# Patient Record
Sex: Female | Born: 1965
Health system: Southern US, Community
[De-identification: ages and names within clinical notes are randomized; demographics above are authoritative.]

## PROBLEM LIST (undated history)

## (undated) DIAGNOSIS — K219 Gastro-esophageal reflux disease without esophagitis: Secondary | ICD-10-CM

## (undated) DIAGNOSIS — T4145XA Adverse effect of unspecified anesthetic, initial encounter: Secondary | ICD-10-CM

## (undated) DIAGNOSIS — F419 Anxiety disorder, unspecified: Secondary | ICD-10-CM

## (undated) DIAGNOSIS — T8859XA Other complications of anesthesia, initial encounter: Secondary | ICD-10-CM

## (undated) DIAGNOSIS — R569 Unspecified convulsions: Secondary | ICD-10-CM

## (undated) HISTORY — PX: ABDOMINAL HYSTERECTOMY: SHX81

## (undated) HISTORY — PX: CHOLECYSTECTOMY: SHX55

## (undated) HISTORY — DX: Unspecified convulsions: R56.9

## (undated) HISTORY — PX: TUBAL LIGATION: SHX77

## (undated) HISTORY — PX: BREAST BIOPSY: SHX20

---

## 2003-06-07 HISTORY — PX: OTHER SURGICAL HISTORY: SHX169

## 2007-06-07 LAB — CONVERTED CEMR LAB

## 2007-08-29 ENCOUNTER — Ambulatory Visit: Payer: Self-pay | Admitting: Family Medicine

## 2007-08-29 DIAGNOSIS — Z862 Personal history of diseases of the blood and blood-forming organs and certain disorders involving the immune mechanism: Secondary | ICD-10-CM

## 2007-08-29 DIAGNOSIS — R209 Unspecified disturbances of skin sensation: Secondary | ICD-10-CM

## 2007-08-29 DIAGNOSIS — G43009 Migraine without aura, not intractable, without status migrainosus: Secondary | ICD-10-CM | POA: Insufficient documentation

## 2007-08-29 HISTORY — DX: Migraine without aura, not intractable, without status migrainosus: G43.009

## 2007-08-29 HISTORY — DX: Unspecified disturbances of skin sensation: R20.9

## 2007-08-29 HISTORY — DX: Personal history of diseases of the blood and blood-forming organs and certain disorders involving the immune mechanism: Z86.2

## 2007-08-30 ENCOUNTER — Encounter: Payer: Self-pay | Admitting: Family Medicine

## 2007-08-30 LAB — CONVERTED CEMR LAB
ALT: 18 units/L (ref 0–35)
AST: 21 units/L (ref 0–37)
Albumin: 4.9 g/dL (ref 3.5–5.2)
Alkaline Phosphatase: 84 units/L (ref 39–117)
BUN: 12 mg/dL (ref 6–23)
Basophils Absolute: 0 10*3/uL (ref 0.0–0.1)
Basophils Relative: 0 % (ref 0–1)
CO2: 22 meq/L (ref 19–32)
Calcium: 9.4 mg/dL (ref 8.4–10.5)
Chloride: 104 meq/L (ref 96–112)
Cholesterol: 160 mg/dL (ref 0–200)
Creatinine, Ser: 0.73 mg/dL (ref 0.40–1.20)
Eosinophils Absolute: 0.2 10*3/uL (ref 0.0–0.7)
Eosinophils Relative: 2 % (ref 0–5)
Glucose, Bld: 95 mg/dL (ref 70–99)
HCT: 39.6 % (ref 36.0–46.0)
HDL: 68 mg/dL (ref >39)
Hemoglobin: 13.4 g/dL (ref 12.0–15.0)
Iron: 112 ug/dL (ref 42–145)
LDL Cholesterol: 77 mg/dL (ref 0–99)
Lymphocytes Relative: 31 % (ref 12–46)
Lymphs Abs: 2.3 10*3/uL (ref 0.7–4.0)
MCHC: 33.8 g/dL (ref 30.0–36.0)
MCV: 92.1 fL (ref 78.0–100.0)
Monocytes Absolute: 0.6 10*3/uL (ref 0.1–1.0)
Monocytes Relative: 9 % (ref 3–12)
Neutro Abs: 4.3 10*3/uL (ref 1.7–7.7)
Neutrophils Relative %: 58 % (ref 43–77)
Platelets: 226 10*3/uL (ref 150–400)
Potassium: 4.4 meq/L (ref 3.5–5.3)
RBC: 4.3 M/uL (ref 3.87–5.11)
RDW: 12.7 % (ref 11.5–15.5)
Saturation Ratios: 28 % (ref 20–55)
Sodium: 139 meq/L (ref 135–145)
TIBC: 404 ug/dL (ref 250–470)
Total Bilirubin: 0.6 mg/dL (ref 0.3–1.2)
Total CHOL/HDL Ratio: 2.4
Total Protein: 7.3 g/dL (ref 6.0–8.3)
Triglycerides: 76 mg/dL (ref <150)
UIBC: 292 ug/dL
VLDL: 15 mg/dL (ref 0–40)
WBC: 7.4 10*3/uL (ref 4.0–10.5)

## 2009-01-30 ENCOUNTER — Ambulatory Visit: Payer: Self-pay | Admitting: Family Medicine

## 2009-01-30 DIAGNOSIS — J309 Allergic rhinitis, unspecified: Secondary | ICD-10-CM

## 2009-01-30 HISTORY — DX: Allergic rhinitis, unspecified: J30.9

## 2009-04-13 ENCOUNTER — Ambulatory Visit: Payer: Self-pay | Admitting: Family Medicine

## 2009-04-15 ENCOUNTER — Telehealth: Payer: Self-pay | Admitting: Family Medicine

## 2010-03-17 ENCOUNTER — Encounter: Admission: RE | Admit: 2010-03-17 | Discharge: 2010-03-17 | Payer: Self-pay | Admitting: Family Medicine

## 2010-03-18 LAB — HM MAMMOGRAPHY: HM Mammogram: NORMAL

## 2010-03-30 ENCOUNTER — Ambulatory Visit: Payer: Self-pay | Admitting: Obstetrics & Gynecology

## 2010-07-02 ENCOUNTER — Ambulatory Visit
Admission: RE | Admit: 2010-07-02 | Discharge: 2010-07-02 | Payer: Self-pay | Source: Home / Self Care | Attending: Family Medicine | Admitting: Family Medicine

## 2010-07-02 DIAGNOSIS — F411 Generalized anxiety disorder: Secondary | ICD-10-CM

## 2010-07-02 HISTORY — DX: Generalized anxiety disorder: F41.1

## 2010-07-03 ENCOUNTER — Encounter: Payer: Self-pay | Admitting: Family Medicine

## 2010-07-04 LAB — CONVERTED CEMR LAB: TSH: 1.239 microintl units/mL (ref 0.350–4.500)

## 2010-07-06 NOTE — Assessment & Plan Note (Signed)
Summary: Acute Bronchitis   Vital Signs:  Patient profile:   45 year old female Height:      61.25 inches Weight:      132 pounds BMI:     24.83 O2 Sat:      98 % on Room air Temp:     97.0 degrees F oral Pulse rate:   80 / minute BP sitting:   108 / 64  (left arm) Cuff size:   regular  Vitals Entered By: Kathlene November (April 13, 2009 1:02 PM)  O2 Flow:  Room air CC: sore throat, productive cough with green blood tinged sputum, low grade temp, chest congestion, SOB, sneezing. Started last Wednesday   Primary Care Provider:  Linford Arnold, C  CC:  sore throat, productive cough with green blood tinged sputum, low grade temp, chest congestion, SOB, and sneezing. Started last Wednesday.  History of Present Illness: sore throat, productive cough with green blood tinged sputum, low grade temp, chest congestion, SOB, sneezing. Started last Wednesday. Felt she is getting worse. Nasal congestion.  Tried some  mucinex and helped some. Says SOB causes a cough. Thinks she could be [redacted] weeks pregnant but she is bleeding right now.   Current Medications (verified): 1)  None  Allergies (verified): 1)  ! Demerol  Comments:  Nurse/Medical Assistant: The patient's medications and allergies were reviewed with the patient and were updated in the Medication and Allergy Lists. Kathlene November (April 13, 2009 1:03 PM)  Physical Exam  General:  Well-developed,well-nourished,in no acute distress; alert,appropriate and cooperative throughout examination Head:  Normocephalic and atraumatic without obvious abnormalities. No apparent alopecia or balding. Eyes:  No corneal or conjunctival inflammation noted. EOMI. Perrla. Ears:  External ear exam shows no significant lesions or deformities.  Otoscopic examination reveals clear canals, tympanic membranes are intact bilaterally without bulging, retraction, inflammation or discharge. Hearing is grossly normal bilaterally. Nose:  External nasal examination  shows no deformity or inflammation. Nasal mucosa are pink and moist without lesions or exudates. Mouth:  Oral mucosa and oropharynx without lesions or exudates.  Teeth in good repair. Neck:  Mild anterior cerv LN>  Lungs:  Normal respiratory effort, chest expands symmetrically. Lungs are clear to auscultation, no crackles or wheezes. Heart:  Normal rate and regular rhythm. S1 and S2 normal without gallop, murmur, click, rub or other extra sounds. Pulses:  Radial 2+  Skin:  no rashes.   Cervical Nodes:  Mild anterior cerv LN.  Psych:  Cognition and judgment appear intact. Alert and cooperative with normal attention span and concentration. No apparent delusions, illusions, hallucinations   Impression & Recommendations:  Problem # 1:  BRONCHITIS, ACUTE (ICD-466.0)  Her updated medication list for this problem includes:    Hydrocodone-homatropine 5-1.5 Mg/56ml Syrp (Hydrocodone-homatropine) .[name] KitchenMarland KitchenMarland KitchenMarland Kitchen 5ml by mouth at bedtime as needed   Encouraged to push clear liquids, get enough rest, and take acetaminophen as needed. To be seen in 5-7 days if no improvement, sooner if worse. Syrup at bedtime as needed . It is category C but will use sparingly. If not better in a couple of days then will treat with Z-pack.   Complete Medication List: 1)  Hydrocodone-homatropine 5-1.5 Mg/34ml Syrp (Hydrocodone-homatropine) .... 5ml by mouth at bedtime as needed Prescriptions: HYDROCODONE-HOMATROPINE 5-1.5 MG/5ML SYRP (HYDROCODONE-HOMATROPINE) 5ml by mouth at bedtime as needed  #123ml x 0   Entered and Authorized by:   Nani Gasser MD   Signed by:   Nani Gasser MD on 04/13/2009   Method used:  Print then Give to Patient   RxID:   [id number]

## 2010-07-06 NOTE — Progress Notes (Signed)
Summary: Need a Rx called in  Phone Note Call from Patient   Caller: Patient Summary of Call: Dr.Metheney     Patient's Call Back [phone]         [facility] [address]  [phone]  Patient was seen on Monday and was told if she wasnt feeling better by today that an Antibotic could be called in for her. Initial call taken by: Vanessa Swaziland,  April 15, 2009 8:24 AM  Follow-up for Phone Call        OK, rx sent.  Follow-up by: Nani Gasser MD,  April 15, 2009 9:13 AM  Additional Follow-up for Phone Call Additional follow up Details #1::        pt notified med sent to her pharmacy Additional Follow-up by: Kathlene November,  April 15, 2009 9:16 AM    New/Updated Medications: ZITHROMAX Z-PAK 250 MG TABS (AZITHROMYCIN) Take as directed. Prescriptions: ZITHROMAX Z-PAK 250 MG TABS (AZITHROMYCIN) Take as directed.  #1 x 0   Entered and Authorized by:   Nani Gasser MD   Signed by:   Nani Gasser MD on 04/15/2009   Method used:   [facility]  [id number]       [address]       [place], [place]  [address]       Ph: [phone] or [phone]       Fax: [phone]   RxID:   [id number]

## 2010-07-13 ENCOUNTER — Telehealth: Payer: Self-pay | Admitting: Family Medicine

## 2010-07-14 NOTE — Assessment & Plan Note (Signed)
Summary: Destiny King, Anxiety   Vital Signs:  Patient profile:   45 year old female Height:      61.25 inches Weight:      142 pounds Pulse rate:   96 / minute BP sitting:   121 / 77  (right arm) Cuff size:   regular  Vitals Entered By: Avon Gully CMA, Duncan Dull) (July 02, 2010 1:57 PM) CC: HA, sinus pressure, face hurts   Primary Care Provider:  Metheney, C  CC:  HA, sinus pressure, and face hurts.  History of Present Illness: HA, sinus pressure, face hurts.  Left maxillary pressure for one week. NO fever. Feels weak and ache all over.  Head is tender to touch.  Teeth hurt on the left side as well.  No recent URI.  Taking mucinex and helps some.  No sinus congestion, + post nasal drip.   Has been more jittery and anxious for years but was never really a problem until more recently. Feels shakey on the inside.  Last few periods have been a little off the last 3-4 cycles.  Seems worse before her periods. Notices worse before her periods. Gets severe breast pain and swelling.  Sleeping OK. Occ feels down but moslt anxieious.  Not concentrating well.   Current Medications (verified): 1)  None  Allergies (verified): 1)  ! Demerol  Comments:  Nurse/Medical Assistant: The patient's medications and allergies were reviewed with the patient and were updated in the Medication and Allergy Lists. Avon Gully CMA, Duncan Dull) (July 02, 2010 1:57 PM)  Social History: Reently lost her job.  married  with 2 children.  One child still lives at home.   Never Smoked Alcohol use-no Drug use-no Regular exercise-no  Physical Exam  General:  Well-developed,well-nourished,in no acute distress; alert,appropriate and cooperative throughout examination Head:  Normocephalic and atraumatic without obvious abnormalities. No apparent alopecia or balding. Eyes:  No corneal or conjunctival inflammation noted. EOMI. Perrla.  Ears:  External ear exam shows no significant lesions or deformities.   Otoscopic examination reveals clear canals, tympanic membranes are intact bilaterally without bulging, retraction, inflammation or discharge. Hearing is grossly normal bilaterally. Nose:  External nasal examination shows no deformity or inflammation.  Mouth:  Oral mucosa and oropharynx without lesions or exudates.  Teeth in good repair. Neck:  No deformities, masses, or tenderness noted. Lungs:  Normal respiratory effort, chest expands symmetrically. Lungs are clear to auscultation, no crackles or wheezes. Heart:  Normal rate and regular rhythm. S1 and S2 normal without gallop, murmur, click, rub or other extra sounds. Skin:  no rashes.   Cervical Nodes:  No lymphadenopathy noted Psych:  Cognition and judgment appear intact. Alert and cooperative with normal attention span and concentration. No apparent delusions, illusions, hallucinations   Impression & Recommendations:  Problem # 1:  SINUSITIS - ACUTE-NOS (ICD-461.9) Assessment New  The following medications were removed from the medication list:    Hydrocodone-homatropine 5-1.5 Mg/62ml Syrp (Hydrocodone-homatropine) .[name] KitchenMarland KitchenMarland KitchenMarland Kitchen 5ml by mouth at bedtime as needed    Zithromax Z-pak 250 Mg Tabs (Azithromycin) .[name] Kitchen... Take as directed. Her updated medication list for this problem includes:    Amoxicillin 500 Mg Caps (Amoxicillin) .[name] Kitchen... Take 1 tablet by mouth two times a day for 10 days  Instructed on treatment. Call if symptoms persist or worsen.   Problem # 2:  ANXIETY (ICD-300.00) Assessment: New Dsicussed that I will have her fill out a PHQ- 9 today and then we can check her thyroid level today. If normal then f/u in  one week to discuss tx options. She is not interested in birth control even thougth she feels her hormones are likely affecting her anxiety. .  Orders: T-TSH ([id number])  Complete Medication List: 1)  Amoxicillin 500 Mg Caps (Amoxicillin) .... Take 1 tablet by mouth two times a day for 10 days  Patient Instructions: 1)   Complete the antibiotic and call if not better in 2 weeks. 2)  Please follow-up in one week to discuss your anxiety symptoms.  Prescriptions: AMOXICILLIN 500 MG CAPS (AMOXICILLIN) Take 1 tablet by mouth two times a day for 10 days  #20 x 0   Entered and Authorized by:   Nani Gasser MD   Signed by:   Nani Gasser MD on 07/02/2010   Method used:   [facility]        [facility]  [id number] ([facility]ail[facility]       [address]       [place], [place]  [address]       Ph: [phone] or [phone]       Fax: [phone]   RxID:   [id number]    Orders Added: 1)  T-TSH [[id number]] 2)  Est. Patient Level IV [[address]64]

## 2010-07-22 NOTE — Progress Notes (Signed)
Summary: meds  Phone Note Call from Patient   Caller: Patient Call For: Destiny Gasser MD Summary of Call: pt called and states she has a hx of mild seizures and the medication sent in states to not take if she has a hx of seizures.Pt wants [place] to cancel rx Initial call taken by: Avon Gully CMA, Duncan Dull),  July 13, 2010 3:04 PM  Follow-up for Phone Call        I have never heard that contraindication but will change her to citalopram.  Follow-up by: Destiny Gasser MD,  July 14, 2010 7:23 AM  Additional Follow-up for Phone Call Additional follow up Details #1::        pt notified Additional Follow-up by: Avon Gully CMA, Duncan Dull),  July 15, 2010 10:01 AM    New/Updated Medications: CITALOPRAM HYDROBROMIDE 20 MG TABS (CITALOPRAM HYDROBROMIDE) 1/2 tab by mouth daily for one week, then inc to whole tab daily. Prescriptions: CITALOPRAM HYDROBROMIDE 20 MG TABS (CITALOPRAM HYDROBROMIDE) 1/2 tab by mouth daily for one week, then inc to whole tab daily.  #30 x 0   Entered and Authorized by:   Destiny Gasser MD   Signed by:   Destiny Gasser MD on 07/14/2010   Method used:   [facility]        [facility]  [id number] (retail)       [address]       [place], [place]  [address]       Ph: [phone] or [phone]       Fax: [phone]   RxID:   [id number]

## 2010-07-22 NOTE — Progress Notes (Signed)
Summary: anxiety  Phone Note Call from Patient   Caller: Patient Call For: Nani Gasser MD Summary of Call: pt called and states she was told that she could call in and discuss anxiety over the phone and could possibly get something rx'ed for her nerves instead of having to come in and pay another copay Initial call taken by: Avon Gully CMA, Duncan Dull),  July 13, 2010 9:59 AM  Follow-up for Phone Call        [facility], will call something in. Then needs to f/u in 3-4 weeks so we can adjust med and make sure she is tolerating well. Med sent is gerneric. on $4 list at [facility] and [facility]  Follow-up by: Nani Gasser MD,  July 13, 2010 11:32 AM  Additional Follow-up for Phone Call Additional follow up Details #1::        pt notified Additional Follow-up by: Avon Gully CMA, Duncan Dull),  July 13, 2010 1:02 PM    New/Updated Medications: FLUOXETINE HCL 20 MG TABS (FLUOXETINE HCL) 1/2 tab by mouth daily for one week, then inc to whole tab daily Prescriptions: FLUOXETINE HCL 20 MG TABS (FLUOXETINE HCL) 1/2 tab by mouth daily for one week, then inc to whole tab daily  #30 x 0   Entered and Authorized by:   Nani Gasser MD   Signed by:   Nani Gasser MD on 07/13/2010   Method used:   [facility]        [facility]  [id number] (retail)       [address]       [place], [place]  [address]       Ph: [phone] or [phone]       Fax: [phone]   RxID:   [id number]

## 2010-10-19 NOTE — Assessment & Plan Note (Signed)
NAME:  Destiny King, Destiny King              ACCOUNT NO.:  [id number]   MEDICAL RECORD NO.:  [id number]          PATIENT TYPE:  POB   LOCATION:  [facility] [place]         FACILITY:  [facility]   PHYSICIAN:  Elsie Lincoln, MD      DATE OF BIRTH:  04-Feb-1966   DATE OF SERVICE:  03/30/2010                                  CLINIC NOTE   The patient is a 45 year old G14, para 2-0-12-2, LMP March 20, 2010,  who presents for yearly exam.  The patient had 2 babies that are now in  their 50s.  She had tubal ligation and a tubal reversal.  She then had  several miscarriages, some infertility treatments and they all ended  with miscarriage.  There were documented IUPs 5-7 weeks, but never had a  heart beat.  She has had 4 workups for antiphospholipid syndrome and  other testings.  She has not had any genetic workup.  At this point she  said she is satisfied with not being pregnant, but she does not want  contraception.  Her menstrual cycle is usually very regular every 30  days, last cycle was 40 days, but she is undergoing a lot of stress  because she is not employed, so there is a slight chance that she could  have been pregnant and had a very early miscarriage, but is uncertain.  She does have hot flashes occasionally, so it could be a start of  menopause, but this is less likely given that her cycles have been  completely regular up until this one last period that was 10 days late.   PAST MEDICAL HISTORY:  Childhood seizures and anemia.   GYNECOLOGIC HISTORY:  Gallbladder, Nissen fundoplication, tubal  ligation, tubal reversal.   OBSTETRICAL HISTORY:  NSVD x2, miscarriages x12, not using  contraception.  No history of abnormal Pap smears.  No history of cryo  or LEEP.  No history of sexually transmitted diseases, endometriosis or  fibroids.  She did have a history of ovarian cyst, but is resolved.   MEDICATIONS:  None.   ALLERGIES:  No allergies.   FAMILY HISTORY:  Positive for heart disease,  high blood pressure.  No  history of breast, colon, ovary or uterus.  No history of blood clots in  legs or lungs.   SOCIAL HISTORY:  Negative for smoking, drinking or drugs.   REVIEW OF SYSTEMS:  Systemic review is positive for menopausal symptoms,  hot flashes and some anxiety around her period.   PHYSICAL EXAMINATION:  VITAL SIGNS:  Pulse 77, blood pressure 117/70,  weight 138, height 61 inches.  GENERAL:  Well nourished, well developed, no apparent stress.  HEENT:  Normocephalic, atraumatic.  Good dentition.  NECK:  Thyroid, no masses.  LUNGS:  Clear to auscultation bilaterally.  HEART:  Regular rate and rhythm.  BREAST:  The patient refused.  ABDOMEN:  The patient is very embarrassed of abdomen.  She does have  stretch marks and some scars from her incisions.  Her abdomen is thin,  nontender.  No organomegaly.  No hernia.  We had an extensive talk about  not being embarrassed about her abdominal exam, but she does feel like  she  does not like to be seen without her clothes on, this makes to deter  personal relationships.  PELVIC:  Genitalia, Tanner V.  Vagina pink, normal rugae.  Cervix is  closed, nontender.  Uterus anteverted, nontender.  Adnexa; no masses,  nontender.  EXTREMITIES:  No edema.   ASSESSMENT AND PLAN:  A 45 year old G24, para 2-0-12-2 for yearly exam.  1. Pap smear.  2. Mammogram was normal and up-to-date per the patient.  This is      ordered by Dr. Linford Arnold.  3. The patient can come back in a year for yearly exam.  She notes      that her Pap smear not been done.  4. The patient does not want birth control.           ______________________________  Elsie Lincoln, MD     KL/MEDQ  D:  03/30/2010  T:  03/31/2010  Job:  [id number]

## 2010-12-29 ENCOUNTER — Encounter: Payer: Self-pay | Admitting: Family Medicine

## 2010-12-29 ENCOUNTER — Ambulatory Visit (INDEPENDENT_AMBULATORY_CARE_PROVIDER_SITE_OTHER): Payer: PRIVATE HEALTH INSURANCE | Admitting: Family Medicine

## 2010-12-29 VITALS — BP 129/80 | HR 71 | Temp 98.8°F | Ht 63.0 in | Wt 141.0 lb

## 2010-12-29 DIAGNOSIS — R42 Dizziness and giddiness: Secondary | ICD-10-CM

## 2010-12-29 DIAGNOSIS — N390 Urinary tract infection, site not specified: Secondary | ICD-10-CM

## 2010-12-29 DIAGNOSIS — G43009 Migraine without aura, not intractable, without status migrainosus: Secondary | ICD-10-CM

## 2010-12-29 LAB — POCT URINALYSIS DIPSTICK
Bilirubin, UA: NEGATIVE
Glucose, UA: NEGATIVE
Ketones, UA: NEGATIVE
Leukocytes, UA: NEGATIVE
Nitrite, UA: NEGATIVE
Protein, UA: NEGATIVE
Spec Grav, UA: >=1.03
Urobilinogen, UA: 0.2
pH, UA: 5.5

## 2010-12-29 MED ORDER — CIPROFLOXACIN HCL 500 MG PO TABS: 500.0000 mg | ORAL_TABLET | Freq: Two times a day (BID) | ORAL | Status: DC

## 2010-12-29 MED ORDER — RIZATRIPTAN BENZOATE 10 MG PO TBDP: 10.0000 mg | ORAL_TABLET | ORAL | Status: DC | PRN

## 2010-12-29 MED ORDER — PROMETHAZINE HCL 25 MG RE SUPP: 25.0000 mg | Freq: Four times a day (QID) | RECTAL | Status: DC | PRN

## 2010-12-29 NOTE — Patient Instructions (Signed)
For blood in urine/ possible kidney infection:  Take Cipro 500 mg 2 x a day for 7 days. Will call you with culture results in the next 3 days. Drink plenty of water and avoid sex during treatment. Rest and use Ibuprofen as needed.  For moderate to severe migraines, try RX Maxalt, using Phenergan suppositories as needed for nausea.  Return for f/u dizziness and hematuria in 7 days. Will work on Neurology referral at that time.

## 2010-12-29 NOTE — Assessment & Plan Note (Signed)
UA + for blood only.  Will send for culture and empirically cover for pyelo given her other symptoms.  If symptoms including dizziness have not resolved at f/u visit and continues to have hematuria, I suggest a KUB or CT urogram to look for kidney stones though no symptoms of renal colic at this time.

## 2010-12-29 NOTE — Progress Notes (Signed)
  Subjective:    Patient ID: Destiny King, female    DOB: 10-May-1966, 45 y.o.   MRN: [id number]  HPI  45 yo WF presents for a migraine that started on Monday.  She had N/V associated with it.  She has had some L sided flank pain.  She has felt queasy and she feels lightheaded.  She has been sleeping a lot and has been trying to drink enough fluids.  Her urine is a little dark and cloudy.  Her is not having urinary frequency.  No dysuria.  No urgency.  Her HA has subsided today and she but she still feels fatigued and lightheaded.  Denies F/C.    BP 118/70  Pulse 70  Temp(Src) 98.8 F (37.1 C) (Oral)  Ht 5\' 3"  (1.6 m)  Wt 141 lb (63.957 kg)  BMI 24.98 kg/m2  SpO2 98%  LMP 12/18/2010   Review of Systems  Constitutional: Positive for appetite change and fatigue. Negative for fever.  Respiratory: Negative for shortness of breath.   Cardiovascular: Negative for chest pain and palpitations.  Gastrointestinal: Positive for nausea. Negative for diarrhea.  Genitourinary: Positive for flank pain, decreased urine volume and pelvic pain. Negative for dysuria, frequency, hematuria, vaginal discharge, enuresis and difficulty urinating.  Neurological: Positive for seizures (having partial seizures, hx of these), light-headedness and headaches. Negative for dizziness.       Objective:   Physical Exam  Constitutional: She appears well-developed and well-nourished.  HENT:  Mouth/Throat: Oropharynx is clear and moist.  Eyes: Conjunctivae are normal. No scleral icterus.  Neck: Neck supple.  Cardiovascular: Normal rate, regular rhythm and normal heart sounds.   Pulmonary/Chest: Effort normal and breath sounds normal.  Abdominal: Soft. There is tenderness (suprapubic TTP, no suprapubic TTP).  Musculoskeletal: She exhibits no edema.  Lymphadenopathy:    She has no cervical adenopathy.  Neurological:       No tremor  Skin: Skin is warm and dry. No rash noted.  Psychiatric: She has a normal mood  and affect.          Assessment & Plan:  Lightheadedness:  She tested neg for orthostatic hypotension today.  This could be secondary to her recent migraine or from current UTI.  Advised rest and hydration.  Will f/u symptoms of lightheadness in the next wk.

## 2010-12-30 NOTE — Assessment & Plan Note (Signed)
Pt did not have anything to take for migraines.  RX for Maxalt MLT 10 mg tabs given and Phenergan suppositories added for nausea to use prn.  Will have her f/u for her migraines with Dr Linford Arnold.

## 2011-01-01 ENCOUNTER — Telehealth: Payer: Self-pay | Admitting: Family Medicine

## 2011-01-01 LAB — URINE CULTURE: Colony Count: >=100000

## 2011-01-01 NOTE — Telephone Encounter (Signed)
Pls let pt know that her urine culture did grow out bacteria confirming presence of infection which was sensitive to the Cipro I put her on, so she should feel much better after completion of Cipro.  Let us know if any symptoms continue.

## 2011-01-02 ENCOUNTER — Encounter: Payer: Self-pay | Admitting: Family Medicine

## 2011-01-03 NOTE — Telephone Encounter (Signed)
LMOM informing Pt  

## 2011-01-05 ENCOUNTER — Encounter: Payer: Self-pay | Admitting: Family Medicine

## 2011-01-05 ENCOUNTER — Ambulatory Visit (INDEPENDENT_AMBULATORY_CARE_PROVIDER_SITE_OTHER): Payer: PRIVATE HEALTH INSURANCE | Admitting: Family Medicine

## 2011-01-05 DIAGNOSIS — N390 Urinary tract infection, site not specified: Secondary | ICD-10-CM

## 2011-01-05 DIAGNOSIS — R319 Hematuria, unspecified: Secondary | ICD-10-CM

## 2011-01-05 DIAGNOSIS — F411 Generalized anxiety disorder: Secondary | ICD-10-CM

## 2011-01-05 DIAGNOSIS — G43009 Migraine without aura, not intractable, without status migrainosus: Secondary | ICD-10-CM

## 2011-01-05 DIAGNOSIS — N159 Renal tubulo-interstitial disease, unspecified: Secondary | ICD-10-CM

## 2011-01-05 LAB — POCT URINALYSIS DIPSTICK
Bilirubin, UA: NEGATIVE
Glucose, UA: NEGATIVE
Ketones, UA: NEGATIVE
Leukocytes, UA: NEGATIVE
Nitrite, UA: NEGATIVE
Protein, UA: NEGATIVE
Spec Grav, UA: <=1.005
Urobilinogen, UA: 0.2
pH, UA: 5.5

## 2011-01-05 MED ORDER — SUMATRIPTAN SUCCINATE 100 MG PO TABS: 100.0000 mg | ORAL_TABLET | ORAL | Status: DC | PRN

## 2011-01-05 NOTE — Assessment & Plan Note (Signed)
Anxiety which seems to be related to hormonal fluctuations but she declined intervention at this time.  Call if wanting to add counseling.

## 2011-01-05 NOTE — Assessment & Plan Note (Signed)
Clinically much improved after Cipro, completed today and UA looks great.  Since she has had 'mulitple' UTIs this year, will check her renal function with a BMP today.

## 2011-01-05 NOTE — Patient Instructions (Signed)
Urine looks great today! Glad you are feeling better.  Consider counseling for anxiety.  Use sumatriptan up to 2 x a wk as needed for migraines.  Take at onset of moderate to severe headache.  Check BMP today.  Will call you w/ result tomorrow.  F/u with neurology

## 2011-01-05 NOTE — Progress Notes (Signed)
  Subjective:    Patient ID: Destiny King, female    DOB: 1965/08/07, 45 y.o.   MRN: [id number]  HPI  45 yo WF presents for f/u UTI.  Her UA looks good today.  She took Cipro and finished it out today.  Her flank pain has resolved.  Her dizziness is still present but this is more of a mild lightheadedness and she has made her own neurology appt..  Denies any pelvic pain, fevers or chills, dysuria, frequency or urgency.    She did not fill the RX for Maxalt since it was too expensive.  She feels very nervous and irritable before her period starts each month.  Dr Linford Arnold put her on celexa but she stopped it because it made her 'feel funny'.  She exercises regularly and has a good support system.  She declined treatment at this time.    BP 102/73  Pulse 67  Temp(Src) 98.8 F (37.1 C) (Oral)  Ht 5\' 2"  (1.575 m)  Wt 141 lb (63.957 kg)  BMI 25.79 kg/m2  LMP 12/18/2010  Review of Systems  Constitutional: Negative for fever, chills and fatigue.  Eyes: Negative for visual disturbance.  Genitourinary: Negative for dysuria, urgency, frequency, flank pain and pelvic pain.  Musculoskeletal: Negative for back pain.  Neurological: Positive for dizziness and headaches (not very often).       Objective:   Physical Exam  Constitutional: She appears well-developed and well-nourished. No distress.  HENT:  Mouth/Throat: Oropharynx is clear and moist.  Cardiovascular: Normal rate, regular rhythm and normal heart sounds.   Pulmonary/Chest: Effort normal and breath sounds normal.  Musculoskeletal: She exhibits no edema.  Lymphadenopathy:    She has no cervical adenopathy.  Skin: Skin is warm and dry.  Psychiatric: She has a normal mood and affect.          Assessment & Plan:

## 2011-01-05 NOTE — Assessment & Plan Note (Signed)
She rarely has migraines and I doubt this is the cause for her lightheadedness. Maxalt was too expensive, so I changed her to Imitrex (generic ) prn.

## 2011-01-06 ENCOUNTER — Telehealth: Payer: Self-pay | Admitting: Family Medicine

## 2011-01-06 LAB — BASIC METABOLIC PANEL WITH GFR
BUN: 13 mg/dL (ref 6–23)
CO2: 23 mEq/L (ref 19–32)
Calcium: 9.7 mg/dL (ref 8.4–10.5)
Chloride: 103 mEq/L (ref 96–112)
Creat: 0.79 mg/dL (ref 0.50–1.10)
GFR, Est African American: >60 mL/min (ref >60)
GFR, Est Non African American: >60 mL/min (ref >60)
Glucose, Bld: 95 mg/dL (ref 70–99)
Potassium: 4.4 mEq/L (ref 3.5–5.3)
Sodium: 139 mEq/L (ref 135–145)

## 2011-01-06 NOTE — Telephone Encounter (Signed)
Advised pt of results.

## 2011-01-06 NOTE — Telephone Encounter (Signed)
Pls let pt know that her chemistry panel came back normal.. 

## 2011-02-14 ENCOUNTER — Encounter: Payer: Self-pay | Admitting: Family Medicine

## 2011-02-14 DIAGNOSIS — G40A09 Absence epileptic syndrome, not intractable, without status epilepticus: Secondary | ICD-10-CM | POA: Insufficient documentation

## 2011-02-14 DIAGNOSIS — G40309 Generalized idiopathic epilepsy and epileptic syndromes, not intractable, without status epilepticus: Secondary | ICD-10-CM | POA: Insufficient documentation

## 2011-02-14 HISTORY — DX: Absence epileptic syndrome, not intractable, without status epilepticus: G40.A09

## 2011-02-14 HISTORY — DX: Generalized idiopathic epilepsy and epileptic syndromes, not intractable, without status epilepticus: G40.309

## 2013-09-04 ENCOUNTER — Other Ambulatory Visit: Payer: Self-pay | Admitting: Family Medicine

## 2013-09-04 DIAGNOSIS — Z1231 Encounter for screening mammogram for malignant neoplasm of breast: Secondary | ICD-10-CM

## 2013-09-05 ENCOUNTER — Ambulatory Visit: Payer: PRIVATE HEALTH INSURANCE | Admitting: Family Medicine

## 2013-09-05 ENCOUNTER — Ambulatory Visit (INDEPENDENT_AMBULATORY_CARE_PROVIDER_SITE_OTHER): Payer: PRIVATE HEALTH INSURANCE

## 2013-09-05 DIAGNOSIS — Z1231 Encounter for screening mammogram for malignant neoplasm of breast: Secondary | ICD-10-CM

## 2014-01-10 ENCOUNTER — Encounter: Payer: Self-pay | Admitting: Physician Assistant

## 2014-01-10 ENCOUNTER — Ambulatory Visit (INDEPENDENT_AMBULATORY_CARE_PROVIDER_SITE_OTHER): Payer: PRIVATE HEALTH INSURANCE | Admitting: Physician Assistant

## 2014-01-10 ENCOUNTER — Telehealth: Payer: Self-pay | Admitting: *Deleted

## 2014-01-10 VITALS — BP 129/66 | HR 75 | Temp 98.5°F | Wt 125.0 lb

## 2014-01-10 DIAGNOSIS — K625 Hemorrhage of anus and rectum: Secondary | ICD-10-CM

## 2014-01-10 DIAGNOSIS — R195 Other fecal abnormalities: Secondary | ICD-10-CM

## 2014-01-10 DIAGNOSIS — J0100 Acute maxillary sinusitis, unspecified: Secondary | ICD-10-CM

## 2014-01-10 MED ORDER — AMOXICILLIN 500 MG PO CAPS: 500.0000 mg | ORAL_CAPSULE | Freq: Two times a day (BID) | ORAL | Status: DC

## 2014-01-10 NOTE — Progress Notes (Signed)
   Subjective:    Patient ID: Destiny King, female    DOB: [birth year], 48 y.o.   MRN: [id number]  HPI Pt presents to the clinic with urgent concern of blood with her bowel movements today. Denies any pain or straining. She has stool changes over past year that have concerned her. She has off and on diarrhea and constipation. She has a lot of small stools where she feels like she is not completely getting all of her stool out. Stool this morning was very loose and bright red blood was present denies any dark tarry stools. She has occasional burning in stomach but denies any stomach pain for a while and none today. No acid reflux symptoms. Just had both parents pass away this year. No urinary symptoms today. No abdominal pain. No N/V.   2 weeks ago had URI thought was getting better now has right sided maxillary pressure and teeth pain. No fever, chills, n/v/d. Not tried anything to make better. Nothing seemly makes worse. Going on vacation tomorrow.    Review of Systems  All other systems reviewed and are negative.      Objective:   Physical Exam  Constitutional: She is oriented to person, place, and time. She appears well-developed and well-nourished.  HENT:  Head: Normocephalic and atraumatic.  Right Ear: External ear normal.  Left Ear: External ear normal.  Mouth/Throat: Oropharynx is clear and moist. No oropharyngeal exudate.  TM's clear bilaterally.  Right sided only maxillary tenderness to palpation.  Bilateral nasal turbinates red and swollen.   Eyes: Conjunctivae are normal. Right eye exhibits no discharge. Left eye exhibits no discharge.  Neck: Normal range of motion. Neck supple.  Cardiovascular: Normal rate, regular rhythm and normal heart sounds.   Pulmonary/Chest: Effort normal and breath sounds normal. She has no wheezes.  No CVA tenderness.   Abdominal: Soft. Bowel sounds are normal. She exhibits no distension and no mass. There is no tenderness. There is no rebound and  no guarding.  Genitourinary: Guaiac positive stool.  No external hemorrhoids. No masses palpated internally.   Lymphadenopathy:    She has no cervical adenopathy.  Neurological: She is alert and oriented to person, place, and time.  Skin: Skin is dry.  Psychiatric: She has a normal mood and affect. Her behavior is normal.          Assessment & Plan:  Rectal bleeding- hemoccult positive today. Will refer to digestive health for colonoscopy. Will check CBC, CmP, hgB.for blood loss etc.  Explained no exeternal hemorrhoids today. Could be internal but need evaluation. No pain or straining which is less likely for hemorrhoids. Red flags discussed. Follow up with any worsening symptoms.   Acute maxillary sinusitis- treated with 10 days of amoxicillin. HO given. Consider flonase 2 sprays each nostril for nasal congestion. Follow up as needed.     Pt has not been seen by Dr. Linford Arnold is a while and request that I be her PCP. Changed in system today.

## 2014-01-10 NOTE — Telephone Encounter (Signed)
Pt called and stated she had hysterectomy in April 2014 and this morning she had a soft BM she stated that there was bright red blood in her stool. She denies hemorrhoids at present or straining. She reports that she does not feel as if her bowels are completely emptying which has been going on for a long period of time and burning in her stomach that has been going on for about 1 yr she takes prilosec. She states that this has been the only episode. She does not feel as if she needs to be seen today and she will be leaving for vacation on tomorrow. I spoke with Lesly Rubenstein and she will work pt in today. Pt transferred to scheduling for appt.Loralee Pacas [place]

## 2014-01-10 NOTE — Patient Instructions (Signed)
Will refer to digestive health for colonoscopy.  flonase 2 sprays each nostril.   Sinusitis Sinusitis is redness, soreness, and inflammation of the paranasal sinuses. Paranasal sinuses are air pockets within the bones of your face (beneath the eyes, the middle of the forehead, or above the eyes). In healthy paranasal sinuses, mucus is able to drain out, and air is able to circulate through them by way of your nose. However, when your paranasal sinuses are inflamed, mucus and air can become trapped. This can allow bacteria and other germs to grow and cause infection. Sinusitis can develop quickly and last only a short time (acute) or continue over a long period (chronic). Sinusitis that lasts for more than 12 weeks is considered chronic.  CAUSES  Causes of sinusitis include:  Allergies.  Structural abnormalities, such as displacement of the cartilage that separates your nostrils (deviated septum), which can decrease the air flow through your nose and sinuses and affect sinus drainage.  Functional abnormalities, such as when the small hairs (cilia) that line your sinuses and help remove mucus do not work properly or are not present. SIGNS AND SYMPTOMS  Symptoms of acute and chronic sinusitis are the same. The primary symptoms are pain and pressure around the affected sinuses. Other symptoms include:  Upper toothache.  Earache.  Headache.  Bad breath.  Decreased sense of smell and taste.  A cough, which worsens when you are lying flat.  Fatigue.  Fever.  Thick drainage from your nose, which often is green and may contain pus (purulent).  Swelling and warmth over the affected sinuses. DIAGNOSIS  Your health care provider will perform a physical exam. During the exam, your health care provider may:  Look in your nose for signs of abnormal growths in your nostrils (nasal polyps).  Tap over the affected sinus to check for signs of infection.  View the inside of your sinuses  (endoscopy) using an imaging device that has a light attached (endoscope). If your health care provider suspects that you have chronic sinusitis, one or more of the following tests may be recommended:  Allergy tests.  Nasal culture. A sample of mucus is taken from your nose, sent to a lab, and screened for bacteria.  Nasal cytology. A sample of mucus is taken from your nose and examined by your health care provider to determine if your sinusitis is related to an allergy. TREATMENT  Most cases of acute sinusitis are related to a viral infection and will resolve on their own within 10 days. Sometimes medicines are prescribed to help relieve symptoms (pain medicine, decongestants, nasal steroid sprays, or saline sprays).  However, for sinusitis related to a bacterial infection, your health care provider will prescribe antibiotic medicines. These are medicines that will help kill the bacteria causing the infection.  Rarely, sinusitis is caused by a fungal infection. In theses cases, your health care provider will prescribe antifungal medicine. For some cases of chronic sinusitis, surgery is needed. Generally, these are cases in which sinusitis recurs more than 3 times per year, despite other treatments. HOME CARE INSTRUCTIONS   Drink plenty of water. Water helps thin the mucus so your sinuses can drain more easily.  Use a humidifier.  Inhale steam 3 to 4 times a day (for example, sit in the bathroom with the shower running).  Apply a warm, moist washcloth to your face 3 to 4 times a day, or as directed by your health care provider.  Use saline nasal sprays to help moisten and  clean your sinuses.  Take medicines only as directed by your health care provider.  If you were prescribed either an antibiotic or antifungal medicine, finish it all even if you start to feel better. SEEK IMMEDIATE MEDICAL CARE IF:  You have increasing pain or severe headaches.  You have nausea, vomiting, or  drowsiness.  You have swelling around your face.  You have vision problems.  You have a stiff neck.  You have difficulty breathing. MAKE SURE YOU:   Understand these instructions.  Will watch your condition.  Will get help right away if you are not doing well or get worse. Document Released: 05/23/2005 Document Revised: 10/07/2013 Document Reviewed: 06/07/2011 [facility] Patient Information 2015 [place], [place]. This information is not intended to replace advice given to you by your health care provider. Make sure you discuss any questions you have with your health care provider.

## 2014-01-11 LAB — COMPLETE METABOLIC PANEL WITH GFR
ALT: 20 U/L (ref 0–35)
AST: 21 U/L (ref 0–37)
Albumin: 4.7 g/dL (ref 3.5–5.2)
Alkaline Phosphatase: 40 U/L (ref 39–117)
BUN: 12 mg/dL (ref 6–23)
CO2: 26 mEq/L (ref 19–32)
Calcium: 9.3 mg/dL (ref 8.4–10.5)
Chloride: 105 mEq/L (ref 96–112)
Creat: 0.63 mg/dL (ref 0.50–1.10)
GFR, Est African American: >89 mL/min
GFR, Est Non African American: >89 mL/min
Glucose, Bld: 95 mg/dL (ref 70–99)
Potassium: 4.2 mEq/L (ref 3.5–5.3)
Sodium: 142 mEq/L (ref 135–145)
Total Bilirubin: 0.8 mg/dL (ref 0.2–1.2)
Total Protein: 6.7 g/dL (ref 6.0–8.3)

## 2014-01-11 LAB — CBC WITH DIFFERENTIAL/PLATELET
Basophils Absolute: 0 10*3/uL (ref 0.0–0.1)
Basophils Relative: 1 % (ref 0–1)
Eosinophils Absolute: 0.1 10*3/uL (ref 0.0–0.7)
Eosinophils Relative: 2 % (ref 0–5)
HCT: 38.2 % (ref 36.0–46.0)
Hemoglobin: 13 g/dL (ref 12.0–15.0)
Lymphocytes Relative: 38 % (ref 12–46)
Lymphs Abs: 1.7 10*3/uL (ref 0.7–4.0)
MCH: 30.6 pg (ref 26.0–34.0)
MCHC: 34 g/dL (ref 30.0–36.0)
MCV: 89.9 fL (ref 78.0–100.0)
Monocytes Absolute: 0.5 10*3/uL (ref 0.1–1.0)
Monocytes Relative: 10 % (ref 3–12)
Neutro Abs: 2.3 10*3/uL (ref 1.7–7.7)
Neutrophils Relative %: 49 % (ref 43–77)
Platelets: 224 10*3/uL (ref 150–400)
RBC: 4.25 MIL/uL (ref 3.87–5.11)
RDW: 13.2 % (ref 11.5–15.5)
WBC: 4.6 10*3/uL (ref 4.0–10.5)

## 2014-01-12 DIAGNOSIS — R195 Other fecal abnormalities: Secondary | ICD-10-CM

## 2014-01-12 DIAGNOSIS — K625 Hemorrhage of anus and rectum: Secondary | ICD-10-CM | POA: Insufficient documentation

## 2014-01-12 HISTORY — DX: Other fecal abnormalities: R19.5

## 2014-01-12 HISTORY — DX: Hemorrhage of anus and rectum: K62.5

## 2014-02-04 ENCOUNTER — Encounter: Payer: Self-pay | Admitting: Physician Assistant

## 2014-02-04 DIAGNOSIS — K5790 Diverticulosis of intestine, part unspecified, without perforation or abscess without bleeding: Secondary | ICD-10-CM | POA: Insufficient documentation

## 2014-02-04 DIAGNOSIS — A048 Other specified bacterial intestinal infections: Secondary | ICD-10-CM | POA: Insufficient documentation

## 2014-02-04 HISTORY — DX: Other specified bacterial intestinal infections: A04.8

## 2014-02-07 ENCOUNTER — Telehealth: Payer: Self-pay | Admitting: *Deleted

## 2014-02-07 DIAGNOSIS — G43001 Migraine without aura, not intractable, with status migrainosus: Secondary | ICD-10-CM

## 2014-02-07 MED ORDER — RIZATRIPTAN BENZOATE 10 MG PO TABS: 10.0000 mg | ORAL_TABLET | ORAL | Status: DC | PRN

## 2014-02-07 NOTE — Telephone Encounter (Signed)
Ok to send maxalt #10 1 RF

## 2014-02-07 NOTE — Telephone Encounter (Signed)
Pt wants to know if you would send her in a rx of maxalt for migraines.  She's taken this before in the past.  Pt is having a migraine right now.  [facility] [address]

## 2014-02-07 NOTE — Telephone Encounter (Signed)
Pt notified of rx sent

## 2014-02-21 ENCOUNTER — Other Ambulatory Visit: Payer: Self-pay | Admitting: *Deleted

## 2014-02-21 ENCOUNTER — Encounter: Payer: Self-pay | Admitting: Physician Assistant

## 2014-02-21 MED ORDER — FLUCONAZOLE 150 MG PO TABS: 150.0000 mg | ORAL_TABLET | Freq: Once | ORAL | Status: DC

## 2014-07-01 ENCOUNTER — Ambulatory Visit (INDEPENDENT_AMBULATORY_CARE_PROVIDER_SITE_OTHER): Payer: BLUE CROSS/BLUE SHIELD | Admitting: Physician Assistant

## 2014-07-01 ENCOUNTER — Encounter: Payer: Self-pay | Admitting: Physician Assistant

## 2014-07-01 VITALS — BP 109/68 | HR 80 | Temp 98.6°F | Ht 62.0 in | Wt 123.0 lb

## 2014-07-01 DIAGNOSIS — R7301 Impaired fasting glucose: Secondary | ICD-10-CM | POA: Diagnosis not present

## 2014-07-01 DIAGNOSIS — G43009 Migraine without aura, not intractable, without status migrainosus: Secondary | ICD-10-CM

## 2014-07-01 DIAGNOSIS — G40309 Generalized idiopathic epilepsy and epileptic syndromes, not intractable, without status epilepticus: Secondary | ICD-10-CM

## 2014-07-01 DIAGNOSIS — J309 Allergic rhinitis, unspecified: Secondary | ICD-10-CM | POA: Diagnosis not present

## 2014-07-01 DIAGNOSIS — G40A09 Absence epileptic syndrome, not intractable, without status epilepticus: Secondary | ICD-10-CM | POA: Diagnosis not present

## 2014-07-01 DIAGNOSIS — H9202 Otalgia, left ear: Secondary | ICD-10-CM | POA: Diagnosis not present

## 2014-07-01 DIAGNOSIS — K279 Peptic ulcer, site unspecified, unspecified as acute or chronic, without hemorrhage or perforation: Secondary | ICD-10-CM | POA: Diagnosis not present

## 2014-07-01 HISTORY — DX: Peptic ulcer, site unspecified, unspecified as acute or chronic, without hemorrhage or perforation: K27.9

## 2014-07-01 LAB — POCT GLYCOSYLATED HEMOGLOBIN (HGB A1C): Hemoglobin A1C: 5.3

## 2014-07-01 MED ORDER — METHYLPREDNISOLONE (PAK) 4 MG PO TABS: ORAL_TABLET | ORAL | Status: DC

## 2014-07-01 MED ORDER — ELETRIPTAN HYDROBROMIDE 20 MG PO TABS: 20.0000 mg | ORAL_TABLET | ORAL | Status: DC | PRN

## 2014-07-01 NOTE — Patient Instructions (Signed)
Epidermal Cyst An epidermal cyst is sometimes called a sebaceous cyst, epidermal inclusion cyst, or infundibular cyst. These cysts usually contain a substance that looks "pasty" or "cheesy" and may have a bad smell. This substance is a protein called keratin. Epidermal cysts are usually found on the face, neck, or trunk. They may also occur in the vaginal area or other parts of the genitalia of both men and women. Epidermal cysts are usually small, painless, slow-growing bumps or lumps that move freely under the skin. It is important not to try to pop them. This may cause an infection and lead to tenderness and swelling. CAUSES  Epidermal cysts may be caused by a deep penetrating injury to the skin or a plugged hair follicle, often associated with acne. SYMPTOMS  Epidermal cysts can become inflamed and cause:  Redness.  Tenderness.  Increased temperature of the skin over the bumps or lumps.  Grayish-white, bad smelling material that drains from the bump or lump. DIAGNOSIS  Epidermal cysts are easily diagnosed by your caregiver during an exam. Rarely, a tissue sample (biopsy) may be taken to rule out other conditions that may resemble epidermal cysts. TREATMENT   Epidermal cysts often get better and disappear on their own. They are rarely ever cancerous.  If a cyst becomes infected, it may become inflamed and tender. This may require opening and draining the cyst. Treatment with antibiotics may be necessary. When the infection is gone, the cyst may be removed with minor surgery.  Small, inflamed cysts can often be treated with antibiotics or by injecting steroid medicines.  Sometimes, epidermal cysts become large and bothersome. If this happens, surgical removal in your caregiver's office may be necessary. HOME CARE INSTRUCTIONS  Only take over-the-counter or prescription medicines as directed by your caregiver.  Take your antibiotics as directed. Finish them even if you start to feel  better. SEEK MEDICAL CARE IF:   Your cyst becomes tender, red, or swollen.  Your condition is not improving or is getting worse.  You have any other questions or concerns. MAKE SURE YOU:  Understand these instructions.  Will watch your condition.  Will get help right away if you are not doing well or get worse. Document Released: 04/23/2004 Document Revised: 08/15/2011 Document Reviewed: 11/29/2010 [employer] Patient Information 2015 [employer]. This information is not intended to replace advice given to you by your health care provider. Make sure you discuss any questions you have with your health care provider.  

## 2014-07-01 NOTE — Progress Notes (Signed)
   Subjective:    Patient ID: Destiny King, female    DOB: 11-24-1965, 49 y.o.   MRN: [id number]  HPI  Patient is a 49 year old female who presents to the clinic with left ear pain and worsening migraines.  She reports that she woke up yesterday with left ear pain, left arm pain and left occipital pain. She felt like this induced a migraine. She does feel like her migraines are increasing. She was not having any and now she's having at least once a week. She did take Maxalt which did nothing for her yesterday but she has woken up today headache free. She denies seeing any visual hallucinations or ROS. She denies any ear drainage. She denies any fever, chills, shortness of breath. She denies any recent upper respiratory infection.   \  Patient has been having some dizzy spells over the past couple months. She will all of a sudden get high, clammy, shaky and nauseated. Symptoms did seem to resolve after eating. She has checked her sugar a few times and has been elevated at 122 fasting in the morning. She wonders if she could be developing diabetes. She has a history of heart palpitations with normal echo in 2014. She denies any chest pain, palpitations, headaches or vision changes at time of episodes.        She does have a small lump on her back that she is concerned with.   Review of Systems  All other systems reviewed and are negative.      Objective:   Physical Exam  Constitutional: She is oriented to person, place, and time. She appears well-developed and well-nourished.  HENT:  Head: Normocephalic and atraumatic.  Right Ear: External ear normal.  Left Ear: External ear normal.  Nose: Nose normal.  Mouth/Throat: Oropharynx is clear and moist. No oropharyngeal exudate.  Eyes: Conjunctivae are normal. Right eye exhibits no discharge. Left eye exhibits no discharge.  Neck: Normal range of motion. Neck supple.  Cardiovascular: Normal rate, regular rhythm and normal heart  sounds.   Pulmonary/Chest: Effort normal and breath sounds normal. She has no wheezes.  Lymphadenopathy:    She has no cervical adenopathy.  Neurological: She is alert and oriented to person, place, and time.  Skin: Skin is dry.     Psychiatric: She has a normal mood and affect. Her behavior is normal.          Assessment & Plan:  Left ear pain- no evidence of infection. Could be some eustachian tube dysfucntion or due to migraine symptoms. Medrol dose pack given. Follow up if symptoms persist.   Migraine- will try relpax for rescue. Unfortunately many prevenative medication cause her seizures to increase. Follow up with neurologist for a preventative medication for migraines if having more than 2 a week.   Impaired fasting glucose- .[name] Lab Results  Component Value Date   HGBA1C 5.3 07/01/2014  reassured pt not diabetic.  Could still be going down and causing lightheaded feeling. Small frequent meals follow up for encounter just for this to discuss.     Epidermal cyst of back, left side- discussed come back for removal if wanted. Benign in nature.    Patient presents to the clinic with multiple concerns today. I made her aware that 15 minutes is not enough to effectively discuss all these conditions. Please follow-up with any thing that is ongoing or persistent.

## 2014-07-02 ENCOUNTER — Encounter: Payer: Self-pay | Admitting: Physician Assistant

## 2014-07-07 ENCOUNTER — Encounter: Payer: Self-pay | Admitting: Physician Assistant

## 2014-07-09 ENCOUNTER — Telehealth: Payer: Self-pay | Admitting: Physician Assistant

## 2014-07-09 NOTE — Telephone Encounter (Signed)
Got PA for medication, Relpax 20mg  tablet. [facility] and it was accepted.  Patient's co-pay is now $10.00.  Called and spoke with patient, let her know that the Relpax is now ready for pick up and her co-pay will be $10.00.  WB

## 2014-08-04 ENCOUNTER — Encounter: Payer: Self-pay | Admitting: Physician Assistant

## 2014-08-04 NOTE — Telephone Encounter (Signed)
Destiny King,  Last mammogram in April was normal. Do you want her to have a diagnostic mammogram or ultrasound as she is requesting.

## 2014-08-26 ENCOUNTER — Other Ambulatory Visit: Payer: Self-pay | Admitting: Physician Assistant

## 2014-08-26 DIAGNOSIS — R922 Inconclusive mammogram: Secondary | ICD-10-CM

## 2014-08-26 DIAGNOSIS — Z1239 Encounter for other screening for malignant neoplasm of breast: Secondary | ICD-10-CM

## 2014-08-26 DIAGNOSIS — R9230 Dense breasts, unspecified: Secondary | ICD-10-CM

## 2014-08-26 HISTORY — DX: Dense breasts, unspecified: R92.30

## 2014-08-26 HISTORY — DX: Inconclusive mammogram: R92.2

## 2014-08-26 HISTORY — DX: Encounter for other screening for malignant neoplasm of breast: Z12.39

## 2014-09-25 ENCOUNTER — Other Ambulatory Visit: Payer: Self-pay | Admitting: Physician Assistant

## 2014-09-25 ENCOUNTER — Telehealth: Payer: Self-pay | Admitting: Physician Assistant

## 2014-09-25 DIAGNOSIS — N6459 Other signs and symptoms in breast: Secondary | ICD-10-CM

## 2014-09-25 DIAGNOSIS — Z1239 Encounter for other screening for malignant neoplasm of breast: Secondary | ICD-10-CM

## 2014-09-25 NOTE — Telephone Encounter (Signed)
On 08/27/14 I spoke with [facility], and advised Pt was ready to be scheduled.   Called again today, they will contact Pt today for scheduling. There needs to be one small change with the order- will send to Tandy Gaw for review.

## 2014-10-03 ENCOUNTER — Encounter: Payer: Self-pay | Admitting: Physician Assistant

## 2014-10-13 ENCOUNTER — Other Ambulatory Visit: Payer: Self-pay | Admitting: Physician Assistant

## 2014-10-13 ENCOUNTER — Ambulatory Visit
Admission: RE | Admit: 2014-10-13 | Discharge: 2014-10-13 | Disposition: A | Discharge status: Home / Self Care | Payer: BLUE CROSS/BLUE SHIELD | Source: Ambulatory Visit | Attending: Physician Assistant | Admitting: Physician Assistant

## 2014-10-13 DIAGNOSIS — Z1231 Encounter for screening mammogram for malignant neoplasm of breast: Secondary | ICD-10-CM

## 2014-10-17 ENCOUNTER — Encounter: Payer: Self-pay | Admitting: Family Medicine

## 2014-10-17 ENCOUNTER — Ambulatory Visit (INDEPENDENT_AMBULATORY_CARE_PROVIDER_SITE_OTHER): Payer: BLUE CROSS/BLUE SHIELD | Admitting: Family Medicine

## 2014-10-17 VITALS — BP 114/73 | HR 75 | Wt 122.0 lb

## 2014-10-17 DIAGNOSIS — L723 Sebaceous cyst: Secondary | ICD-10-CM

## 2014-10-17 DIAGNOSIS — L089 Local infection of the skin and subcutaneous tissue, unspecified: Secondary | ICD-10-CM

## 2014-10-17 MED ORDER — CLINDAMYCIN HCL 300 MG PO CAPS: 300.0000 mg | ORAL_CAPSULE | Freq: Three times a day (TID) | ORAL | Status: DC

## 2014-10-17 NOTE — Progress Notes (Signed)
CC: Destiny King is a 49 y.o. female is here for cyst?   Subjective: HPI:  Redness warmth swelling on the posterior lateral left hip. Began a few days ago seems to be getting worse and enlarging. Painful to the touch otherwise not really bothersome. Worse with wearing any pants or underwear. Denies any recent accident or injury. No interventions as of yet. Denies fevers, chills, nausea, swollen lymph nodes or skin changes elsewhere.   Review Of Systems Outlined In HPI  Past Medical History  Diagnosis Date  . Seizures     Past Surgical History  Procedure Laterality Date  . Tubal ligation    . Tubal reversal  2005  . Cholecystectomy     No family history on file.  History   Social History  . Marital Status: Married    Spouse Name: N/A  . Number of Children: N/A  . Years of Education: N/A   Occupational History  . Not on file.   Social History Main Topics  . Smoking status: Never Smoker   . Smokeless tobacco: Not on file  . Alcohol Use: No  . Drug Use: No  . Sexual Activity: Not on file   Other Topics Concern  . Not on file   Social History Narrative     Objective: BP 114/73 mmHg  Pulse 75  Wt 122 lb (55.339 kg)  LMP 12/18/2010  Vital signs reviewed. General: Alert and Oriented, No Acute Distress HEENT: Pupils equal, round, reactive to light. Conjunctivae clear.  External ears unremarkable.  Moist mucous membranes. Lungs: Clear and comfortable work of breathing, speaking in full sentences without accessory muscle use. Cardiac: Regular rate and rhythm.  Neuro: CN II-XII grossly intact, gait normal. Extremities: No peripheral edema.  Strong peripheral pulses.  Mental Status: No depression, anxiety, nor agitation. Logical though process. Skin: Warm and dry. Half centimeter diameter tender inflamed sebaceous cyst on the left posterior hip, appears to be infected with possible mild cellulitis surrounding this nodule. No signs of abscess  Assessment &  Plan: Destiny King was seen today for cyst?.  Diagnoses and all orders for this visit:  Infected sebaceous cyst Orders: -     clindamycin (CLEOCIN) 300 MG capsule; Take 1 capsule (300 mg total) by mouth 3 (three) times daily.   Discussed most effective therapy would be incision and drainage, she's kind of scared about getting this minor surgical procedure and will prefer to see what happens with an antibiotic over the weekend. Follow-up with me if not improving by next week for incision and drainage.  Return if symptoms worsen or fail to improve.

## 2014-10-20 ENCOUNTER — Encounter: Payer: Self-pay | Admitting: Family Medicine

## 2015-01-07 ENCOUNTER — Other Ambulatory Visit: Payer: Self-pay | Admitting: Physician Assistant

## 2015-03-06 ENCOUNTER — Ambulatory Visit (INDEPENDENT_AMBULATORY_CARE_PROVIDER_SITE_OTHER): Payer: BLUE CROSS/BLUE SHIELD | Admitting: Physician Assistant

## 2015-03-06 ENCOUNTER — Ambulatory Visit (HOSPITAL_BASED_OUTPATIENT_CLINIC_OR_DEPARTMENT_OTHER)
Admission: RE | Admit: 2015-03-06 | Discharge: 2015-03-06 | Disposition: A | Discharge status: Home / Self Care | Payer: BLUE CROSS/BLUE SHIELD | Source: Ambulatory Visit | Attending: Physician Assistant | Admitting: Physician Assistant

## 2015-03-06 ENCOUNTER — Encounter: Payer: Self-pay | Admitting: Physician Assistant

## 2015-03-06 VITALS — BP 115/73 | HR 67 | Ht 62.0 in | Wt 123.0 lb

## 2015-03-06 DIAGNOSIS — R1310 Dysphagia, unspecified: Secondary | ICD-10-CM | POA: Diagnosis not present

## 2015-03-06 DIAGNOSIS — R5383 Other fatigue: Secondary | ICD-10-CM | POA: Insufficient documentation

## 2015-03-06 DIAGNOSIS — J301 Allergic rhinitis due to pollen: Secondary | ICD-10-CM | POA: Diagnosis not present

## 2015-03-06 DIAGNOSIS — E049 Nontoxic goiter, unspecified: Secondary | ICD-10-CM | POA: Diagnosis not present

## 2015-03-06 DIAGNOSIS — E010 Iodine-deficiency related diffuse (endemic) goiter: Secondary | ICD-10-CM | POA: Insufficient documentation

## 2015-03-06 DIAGNOSIS — J302 Other seasonal allergic rhinitis: Secondary | ICD-10-CM | POA: Insufficient documentation

## 2015-03-06 HISTORY — DX: Other fatigue: R53.83

## 2015-03-06 HISTORY — DX: Dysphagia, unspecified: R13.10

## 2015-03-06 HISTORY — DX: Allergic rhinitis due to pollen: J30.1

## 2015-03-06 HISTORY — DX: Other seasonal allergic rhinitis: J30.2

## 2015-03-06 HISTORY — DX: Iodine-deficiency related diffuse (endemic) goiter: E01.0

## 2015-03-06 LAB — CBC WITH DIFFERENTIAL/PLATELET
Basophils Absolute: 0 10*3/uL (ref 0.0–0.1)
Basophils Relative: 0 % (ref 0–1)
Eosinophils Absolute: 0.1 10*3/uL (ref 0.0–0.7)
Eosinophils Relative: 1 % (ref 0–5)
HCT: 38.4 % (ref 36.0–46.0)
Hemoglobin: 12.8 g/dL (ref 12.0–15.0)
Lymphocytes Relative: 31 % (ref 12–46)
Lymphs Abs: 1.9 10*3/uL (ref 0.7–4.0)
MCH: 31.5 pg (ref 26.0–34.0)
MCHC: 33.3 g/dL (ref 30.0–36.0)
MCV: 94.6 fL (ref 78.0–100.0)
MPV: 9.8 fL (ref 8.6–12.4)
Monocytes Absolute: 0.5 10*3/uL (ref 0.1–1.0)
Monocytes Relative: 8 % (ref 3–12)
Neutro Abs: 3.7 10*3/uL (ref 1.7–7.7)
Neutrophils Relative %: 60 % (ref 43–77)
Platelets: 222 10*3/uL (ref 150–400)
RBC: 4.06 MIL/uL (ref 3.87–5.11)
RDW: 13 % (ref 11.5–15.5)
WBC: 6.2 10*3/uL (ref 4.0–10.5)

## 2015-03-06 MED ORDER — MONTELUKAST SODIUM 10 MG PO TABS: 10.0000 mg | ORAL_TABLET | Freq: Every day | ORAL | Status: DC

## 2015-03-06 NOTE — Progress Notes (Signed)
   Subjective:    Patient ID: Destiny King, female    DOB: 05-24-1966, 49 y.o.   MRN: [id number]  HPI  Pt presents to the clinic with difficultly swallowing, throat feeling swollen since august. Seems to be getting increasingly worse. She has hx of reflux surgery and had to get her esophagus stretched one time but this feels different. She is more tired than usual. Sensation never goes away. She describes her symptoms as "someone choking her all the time".   Pt has a lot of ongoing nasal congestion and sinus pressure off and on. Takes allergra daily. Does not like nasal sprays.    Review of Systems  All other systems reviewed and are negative.      Objective:   Physical Exam  Constitutional: She is oriented to person, place, and time. She appears well-developed and well-nourished.  HENT:  Head: Normocephalic and atraumatic.  Neck: Thyromegaly present.    Cardiovascular: Normal rate, regular rhythm and normal heart sounds.   Pulmonary/Chest: Effort normal and breath sounds normal.  Neurological: She is alert and oriented to person, place, and time.  Skin: Skin is dry.  Psychiatric: She has a normal mood and affect. Her behavior is normal.          Assessment & Plan:  Problems swallowing/thyromegaly/fatigue- will get thyroid ultrasound. Will check TSH with panel and antibodies. Will also get cbc.certainly could be some GI issues as well due to hx. She has appt with GI doctor next week.   Allergic rhinitis/seasonal allergies- added singular to allegra. Pt does not like but flonase could help a lot. Follow up as needed.

## 2015-03-07 LAB — THYROID PANEL WITH TSH
Free Thyroxine Index: 1.5 (ref 1.4–3.8)
T3 Uptake: 27 % (ref 22–35)
T4, Total: 5.7 ug/dL (ref 4.5–12.0)
TSH: 1.11 u[IU]/mL (ref 0.350–4.500)

## 2015-03-07 LAB — THYROID ANTIBODIES
Thyroglobulin Ab: <1 IU/mL (ref <2)
Thyroperoxidase Ab SerPl-aCnc: 2 IU/mL (ref <9)

## 2015-05-26 ENCOUNTER — Ambulatory Visit (INDEPENDENT_AMBULATORY_CARE_PROVIDER_SITE_OTHER): Payer: BLUE CROSS/BLUE SHIELD | Admitting: Physician Assistant

## 2015-05-26 ENCOUNTER — Encounter: Payer: Self-pay | Admitting: Physician Assistant

## 2015-05-26 VITALS — BP 111/62 | HR 80 | Ht 62.0 in | Wt 124.0 lb

## 2015-05-26 DIAGNOSIS — R319 Hematuria, unspecified: Secondary | ICD-10-CM | POA: Diagnosis not present

## 2015-05-26 DIAGNOSIS — R1012 Left upper quadrant pain: Secondary | ICD-10-CM

## 2015-05-26 DIAGNOSIS — K921 Melena: Secondary | ICD-10-CM | POA: Diagnosis not present

## 2015-05-26 DIAGNOSIS — Z8619 Personal history of other infectious and parasitic diseases: Secondary | ICD-10-CM

## 2015-05-26 DIAGNOSIS — R109 Unspecified abdominal pain: Secondary | ICD-10-CM

## 2015-05-26 DIAGNOSIS — R198 Other specified symptoms and signs involving the digestive system and abdomen: Secondary | ICD-10-CM

## 2015-05-26 LAB — POCT URINALYSIS DIPSTICK
Bilirubin, UA: NEGATIVE
Glucose, UA: NEGATIVE
Ketones, UA: NEGATIVE
Leukocytes, UA: NEGATIVE
Nitrite, UA: NEGATIVE
Protein, UA: NEGATIVE
Spec Grav, UA: <=1.005
Urobilinogen, UA: 0.2
pH, UA: 6.5

## 2015-05-26 NOTE — Progress Notes (Signed)
   Subjective:    Patient ID: Destiny King, female    DOB: September 08, 1965, 49 y.o.   MRN: [id number]  HPI  Patient is a 49 year old female who presents to the clinic with left flank pain, black tarry stools, burning sensation in the left upper abdomen for the last 3-4 weeks. She does have a history of H. pylori infection. She did end up having to take 2 rounds of antibiotics to clear this. She never had the urea breath test to confirm infection was cleared. She has started taking mastic gum to help with recent symptoms. It has helped some. She is concerned because her stools have been dark and sticky. She denies any weakness, fatigue, body aches.     Review of Systems  All other systems reviewed and are negative.      Objective:   Physical Exam  Constitutional: She is oriented to person, place, and time. She appears well-developed and well-nourished.  HENT:  Head: Normocephalic and atraumatic.  Cardiovascular: Normal rate, regular rhythm and normal heart sounds.   Pulmonary/Chest: Effort normal and breath sounds normal. She has no wheezes.  No CVA tenderness bilaterally.  Abdominal: Soft. Bowel sounds are normal.  There is some diffuse tenderness over epigastric and left upper quadrant.  Neurological: She is alert and oriented to person, place, and time.  Skin: Skin is dry.  Psychiatric: She has a normal mood and affect. Her behavior is normal.          Assessment & Plan:  Left upper quadrant abdominal burning/history of H. pylori/black tarry stools- breath test done today. Seems likely could be another h.pylori infection or PUD. Needs to follow up with GI. Encouraged to start carafate. She has some at home. Zantac bid as well. She states PPI make symptoms worse.    Hematuria/left flank pain- UA dipstick positive for trace blood only. Will get mircoscopic and culture. No treatment given at this time. If symptoms continue or worsen could be start of kidney stone. Let us know and can  get CT scan.

## 2015-05-26 NOTE — Patient Instructions (Signed)
Increase carafate to 4 times a day(with meals and at bedtime). Will call with results.  Call if symptoms get worse or change

## 2015-05-27 LAB — URINALYSIS, MICROSCOPIC ONLY
Bacteria, UA: NONE SEEN [HPF]
Casts: NONE SEEN [LPF]
Crystals: NONE SEEN [HPF]
Squamous Epithelial / LPF: NONE SEEN [HPF] (ref <=5)
WBC, UA: NONE SEEN WBC/HPF (ref <=5)
Yeast: NONE SEEN [HPF]

## 2015-05-27 LAB — CBC WITH DIFFERENTIAL/PLATELET
Basophils Absolute: 0 10*3/uL (ref 0.0–0.1)
Basophils Relative: 0 % (ref 0–1)
Eosinophils Absolute: 0.1 10*3/uL (ref 0.0–0.7)
Eosinophils Relative: 1 % (ref 0–5)
HCT: 39.4 % (ref 36.0–46.0)
Hemoglobin: 13 g/dL (ref 12.0–15.0)
Lymphocytes Relative: 33 % (ref 12–46)
Lymphs Abs: 2.1 10*3/uL (ref 0.7–4.0)
MCH: 31.2 pg (ref 26.0–34.0)
MCHC: 33 g/dL (ref 30.0–36.0)
MCV: 94.5 fL (ref 78.0–100.0)
MPV: 10.5 fL (ref 8.6–12.4)
Monocytes Absolute: 0.6 10*3/uL (ref 0.1–1.0)
Monocytes Relative: 9 % (ref 3–12)
Neutro Abs: 3.7 10*3/uL (ref 1.7–7.7)
Neutrophils Relative %: 57 % (ref 43–77)
Platelets: 248 10*3/uL (ref 150–400)
RBC: 4.17 MIL/uL (ref 3.87–5.11)
RDW: 12.7 % (ref 11.5–15.5)
WBC: 6.5 10*3/uL (ref 4.0–10.5)

## 2015-05-27 LAB — COMPLETE METABOLIC PANEL WITH GFR
ALT: 19 U/L (ref 6–29)
AST: 22 U/L (ref 10–35)
Albumin: 4.4 g/dL (ref 3.6–5.1)
Alkaline Phosphatase: 47 U/L (ref 33–115)
BUN: 10 mg/dL (ref 7–25)
CO2: 25 mmol/L (ref 20–31)
Calcium: 9.1 mg/dL (ref 8.6–10.2)
Chloride: 104 mmol/L (ref 98–110)
Creat: 0.63 mg/dL (ref 0.50–1.10)
GFR, Est African American: >89 mL/min (ref >=60)
GFR, Est Non African American: >89 mL/min (ref >=60)
Glucose, Bld: 84 mg/dL (ref 65–99)
Potassium: 4.1 mmol/L (ref 3.5–5.3)
Sodium: 140 mmol/L (ref 135–146)
Total Bilirubin: 1 mg/dL (ref 0.2–1.2)
Total Protein: 6.7 g/dL (ref 6.1–8.1)

## 2015-05-28 LAB — HELICOBACTER PYLORI  SPECIAL ANTIGEN: H. PYLORI Antigen: NOT DETECTED

## 2015-05-28 LAB — URINE CULTURE
Colony Count: NO GROWTH
Organism ID, Bacteria: NO GROWTH

## 2015-05-29 ENCOUNTER — Encounter: Payer: Self-pay | Admitting: Physician Assistant

## 2015-06-11 ENCOUNTER — Ambulatory Visit (INDEPENDENT_AMBULATORY_CARE_PROVIDER_SITE_OTHER): Payer: BLUE CROSS/BLUE SHIELD | Admitting: Physician Assistant

## 2015-06-11 VITALS — BP 127/77 | HR 70 | Temp 98.9°F | Ht 62.0 in | Wt 124.0 lb

## 2015-06-11 DIAGNOSIS — R319 Hematuria, unspecified: Secondary | ICD-10-CM

## 2015-06-11 DIAGNOSIS — R109 Unspecified abdominal pain: Secondary | ICD-10-CM | POA: Diagnosis not present

## 2015-06-11 LAB — POCT URINALYSIS DIPSTICK
Bilirubin, UA: NEGATIVE
Glucose, UA: NEGATIVE
Ketones, UA: 40
Leukocytes, UA: NEGATIVE
Nitrite, UA: NEGATIVE
Protein, UA: NEGATIVE
Spec Grav, UA: 1.02
Urobilinogen, UA: 0.2
pH, UA: 7

## 2015-06-11 NOTE — Progress Notes (Signed)
   Subjective:    Patient ID: Destiny King, female    DOB: 11-Dec-1965, 50 y.o.   MRN: [id number]  HPI  Patient comes in with left flank pain intermittently for the last few weeks and recheck of urine. Denies fever, chills or sweats.  Review of Systems     Objective:   Physical Exam  BP 127/77 mmHg  Pulse 70  Temp(Src) 98.9 F (37.2 C) (Oral)  Ht  (1.575 m)  Wt 124 lb (56.246 kg)  BMI 22.67 kg/m2  LMP 12/18/2010       Assessment & Plan:   Patient has hematuria, UA performed and results charted.  .. Results for orders placed or performed in visit on 06/11/15  POCT Urinalysis Dipstick  Result Value Ref Range   Color, UA yello    Clarity, UA clear    Glucose, UA neg    Bilirubin, UA neg    Ketones, UA 40 mg/dl    Spec Grav, UA 4.098    Blood, UA trace-lysed    pH, UA 7.0    Protein, UA neg    Urobilinogen, UA 0.2    Nitrite, UA neg    Leukocytes, UA Negative Negative   Pt had blood and ketones. Likely ketones are due to dehydration. Will find out if urologist has ever evaluated hematuria. I would also like to get a renal u/s due to intermittent pain of left flank. Jade Breeback PA-C.

## 2015-06-16 ENCOUNTER — Ambulatory Visit (INDEPENDENT_AMBULATORY_CARE_PROVIDER_SITE_OTHER): Payer: BLUE CROSS/BLUE SHIELD

## 2015-06-16 DIAGNOSIS — R319 Hematuria, unspecified: Secondary | ICD-10-CM | POA: Diagnosis not present

## 2015-06-16 DIAGNOSIS — R109 Unspecified abdominal pain: Secondary | ICD-10-CM | POA: Diagnosis not present

## 2015-06-16 NOTE — Progress Notes (Signed)
Patient states she has not had a work up for hematuria.

## 2015-06-17 ENCOUNTER — Other Ambulatory Visit: Payer: Self-pay | Admitting: Physician Assistant

## 2015-06-17 ENCOUNTER — Encounter: Payer: Self-pay | Admitting: Physician Assistant

## 2015-06-17 DIAGNOSIS — R319 Hematuria, unspecified: Secondary | ICD-10-CM

## 2015-06-17 DIAGNOSIS — R109 Unspecified abdominal pain: Secondary | ICD-10-CM

## 2015-06-19 ENCOUNTER — Encounter: Payer: Self-pay | Admitting: Physician Assistant

## 2015-06-19 ENCOUNTER — Ambulatory Visit (HOSPITAL_BASED_OUTPATIENT_CLINIC_OR_DEPARTMENT_OTHER)
Admission: RE | Admit: 2015-06-19 | Discharge: 2015-06-19 | Disposition: A | Discharge status: Home / Self Care | Payer: BLUE CROSS/BLUE SHIELD | Source: Ambulatory Visit | Attending: Physician Assistant | Admitting: Physician Assistant

## 2015-06-19 DIAGNOSIS — R109 Unspecified abdominal pain: Secondary | ICD-10-CM | POA: Insufficient documentation

## 2015-06-19 DIAGNOSIS — R319 Hematuria, unspecified: Secondary | ICD-10-CM | POA: Insufficient documentation

## 2015-06-19 DIAGNOSIS — K5730 Diverticulosis of large intestine without perforation or abscess without bleeding: Secondary | ICD-10-CM | POA: Diagnosis not present

## 2015-06-23 ENCOUNTER — Other Ambulatory Visit: Payer: Self-pay

## 2015-06-23 ENCOUNTER — Encounter: Payer: Self-pay | Admitting: Physician Assistant

## 2015-06-23 DIAGNOSIS — R1084 Generalized abdominal pain: Secondary | ICD-10-CM

## 2015-06-23 DIAGNOSIS — R319 Hematuria, unspecified: Secondary | ICD-10-CM

## 2015-06-24 LAB — LIPASE: Lipase: 20 U/L (ref 7–60)

## 2015-06-25 LAB — URINALYSIS
Bilirubin Urine: NEGATIVE
Glucose, UA: NEGATIVE
Hgb urine dipstick: NEGATIVE
Ketones, ur: NEGATIVE
Leukocytes, UA: NEGATIVE
Nitrite: NEGATIVE
Protein, ur: NEGATIVE
Specific Gravity, Urine: 1.003 (ref 1.001–1.035)
pH: 7 (ref 5.0–8.0)

## 2015-06-29 ENCOUNTER — Telehealth: Payer: Self-pay

## 2015-06-29 DIAGNOSIS — R319 Hematuria, unspecified: Secondary | ICD-10-CM

## 2015-06-29 NOTE — Telephone Encounter (Signed)
Referral to urology

## 2015-09-08 ENCOUNTER — Encounter: Payer: Self-pay | Admitting: Physician Assistant

## 2015-09-11 ENCOUNTER — Other Ambulatory Visit: Payer: Self-pay | Admitting: Physician Assistant

## 2015-09-11 DIAGNOSIS — R319 Hematuria, unspecified: Secondary | ICD-10-CM

## 2015-09-15 DIAGNOSIS — R823 Hemoglobinuria: Secondary | ICD-10-CM | POA: Diagnosis not present

## 2015-09-15 DIAGNOSIS — R3129 Other microscopic hematuria: Secondary | ICD-10-CM | POA: Diagnosis not present

## 2015-09-15 DIAGNOSIS — R339 Retention of urine, unspecified: Secondary | ICD-10-CM | POA: Diagnosis not present

## 2015-09-24 DIAGNOSIS — Z6823 Body mass index (BMI) 23.0-23.9, adult: Secondary | ICD-10-CM | POA: Diagnosis not present

## 2015-09-24 DIAGNOSIS — R319 Hematuria, unspecified: Secondary | ICD-10-CM | POA: Diagnosis not present

## 2015-09-24 DIAGNOSIS — R8271 Bacteriuria: Secondary | ICD-10-CM | POA: Diagnosis not present

## 2015-10-01 ENCOUNTER — Encounter: Payer: Self-pay | Admitting: Physician Assistant

## 2015-10-09 DIAGNOSIS — R5383 Other fatigue: Secondary | ICD-10-CM | POA: Diagnosis not present

## 2015-10-09 DIAGNOSIS — N951 Menopausal and female climacteric states: Secondary | ICD-10-CM | POA: Diagnosis not present

## 2015-10-09 NOTE — Telephone Encounter (Signed)
Not really sure what dx to use for "hormones".

## 2015-10-12 ENCOUNTER — Other Ambulatory Visit: Payer: Self-pay | Admitting: Physician Assistant

## 2015-10-12 DIAGNOSIS — N951 Menopausal and female climacteric states: Secondary | ICD-10-CM

## 2015-10-12 HISTORY — DX: Menopausal and female climacteric states: N95.1

## 2015-10-15 ENCOUNTER — Other Ambulatory Visit: Payer: Self-pay | Admitting: Physician Assistant

## 2015-10-15 ENCOUNTER — Ambulatory Visit (HOSPITAL_BASED_OUTPATIENT_CLINIC_OR_DEPARTMENT_OTHER)
Admission: RE | Admit: 2015-10-15 | Discharge: 2015-10-15 | Disposition: A | Discharge status: Home / Self Care | Payer: BLUE CROSS/BLUE SHIELD | Source: Ambulatory Visit | Attending: Physician Assistant | Admitting: Physician Assistant

## 2015-10-15 DIAGNOSIS — R928 Other abnormal and inconclusive findings on diagnostic imaging of breast: Secondary | ICD-10-CM | POA: Insufficient documentation

## 2015-10-15 DIAGNOSIS — Z1231 Encounter for screening mammogram for malignant neoplasm of breast: Secondary | ICD-10-CM | POA: Diagnosis not present

## 2015-10-16 ENCOUNTER — Other Ambulatory Visit: Payer: Self-pay | Admitting: Physician Assistant

## 2015-10-16 DIAGNOSIS — R928 Other abnormal and inconclusive findings on diagnostic imaging of breast: Secondary | ICD-10-CM

## 2015-10-20 DIAGNOSIS — R319 Hematuria, unspecified: Secondary | ICD-10-CM | POA: Diagnosis not present

## 2015-10-23 ENCOUNTER — Ambulatory Visit
Admission: RE | Admit: 2015-10-23 | Discharge: 2015-10-23 | Disposition: A | Discharge status: Home / Self Care | Payer: BLUE CROSS/BLUE SHIELD | Source: Ambulatory Visit | Attending: Physician Assistant | Admitting: Physician Assistant

## 2015-10-23 ENCOUNTER — Other Ambulatory Visit: Payer: Self-pay | Admitting: Physician Assistant

## 2015-10-23 DIAGNOSIS — R928 Other abnormal and inconclusive findings on diagnostic imaging of breast: Secondary | ICD-10-CM

## 2015-10-23 DIAGNOSIS — N6489 Other specified disorders of breast: Secondary | ICD-10-CM | POA: Diagnosis not present

## 2015-10-23 DIAGNOSIS — R922 Inconclusive mammogram: Secondary | ICD-10-CM | POA: Diagnosis not present

## 2015-10-23 HISTORY — DX: Other abnormal and inconclusive findings on diagnostic imaging of breast: R92.8

## 2015-10-27 ENCOUNTER — Encounter: Payer: Self-pay | Admitting: Physician Assistant

## 2015-10-27 ENCOUNTER — Ambulatory Visit
Admission: RE | Admit: 2015-10-27 | Discharge: 2015-10-27 | Disposition: A | Discharge status: Home / Self Care | Payer: BLUE CROSS/BLUE SHIELD | Source: Ambulatory Visit | Attending: Physician Assistant | Admitting: Physician Assistant

## 2015-10-27 ENCOUNTER — Other Ambulatory Visit: Payer: Self-pay | Admitting: Physician Assistant

## 2015-10-27 DIAGNOSIS — R928 Other abnormal and inconclusive findings on diagnostic imaging of breast: Secondary | ICD-10-CM

## 2015-10-27 DIAGNOSIS — N6489 Other specified disorders of breast: Secondary | ICD-10-CM | POA: Diagnosis not present

## 2015-10-27 DIAGNOSIS — N6012 Diffuse cystic mastopathy of left breast: Secondary | ICD-10-CM | POA: Diagnosis not present

## 2015-10-27 DIAGNOSIS — R319 Hematuria, unspecified: Secondary | ICD-10-CM

## 2015-10-27 HISTORY — DX: Hematuria, unspecified: R31.9

## 2015-11-05 ENCOUNTER — Other Ambulatory Visit: Payer: Self-pay | Admitting: General Surgery

## 2015-11-05 DIAGNOSIS — R928 Other abnormal and inconclusive findings on diagnostic imaging of breast: Secondary | ICD-10-CM | POA: Diagnosis not present

## 2015-11-05 DIAGNOSIS — N6489 Other specified disorders of breast: Secondary | ICD-10-CM | POA: Diagnosis not present

## 2015-11-09 ENCOUNTER — Other Ambulatory Visit: Payer: Self-pay | Admitting: General Surgery

## 2015-11-09 ENCOUNTER — Encounter (HOSPITAL_BASED_OUTPATIENT_CLINIC_OR_DEPARTMENT_OTHER): Payer: Self-pay | Admitting: *Deleted

## 2015-11-09 DIAGNOSIS — R928 Other abnormal and inconclusive findings on diagnostic imaging of breast: Secondary | ICD-10-CM

## 2015-11-11 ENCOUNTER — Ambulatory Visit
Admission: RE | Admit: 2015-11-11 | Discharge: 2015-11-11 | Disposition: A | Discharge status: Home / Self Care | Payer: BLUE CROSS/BLUE SHIELD | Source: Ambulatory Visit | Attending: General Surgery | Admitting: General Surgery

## 2015-11-11 DIAGNOSIS — R928 Other abnormal and inconclusive findings on diagnostic imaging of breast: Secondary | ICD-10-CM

## 2015-11-12 NOTE — H&P (Signed)
History of Present Illness Destiny Salina T. Jervon Ream MD; 11/05/2015 11:18 AM) Patient words: consultation.  The patient is a 50 year old female who presents with a complaint of Breast problems. Patient is referred by Dr. Norva Pavlov for a recent abnormal mammogram and potentially discordant core biopsy. She is a 50 year old female with no previous history of breast disease or breast cancer. She does have chronically "sore" breasts cyclically. She has had previous screening mammogram and tomograms and dense breast tissue noted. She recently presented for her routine screening mammogram with no symptoms at that time. Breast tissue was noted to be extremely dense. In the left breast there was a potential area of distortion and diagnostic imaging was recommended. Tomosynthesis was obtained showing a persistent area of distortion in the upper inner left breast measuring approximately 1.7 cm. Targeted ultrasound showed normal-appearing dense fibroglandular tissue. Stereotactic core biopsy was recommended and was performed on 10/27/2015. Pathology revealed pseudo-angiomatous stromal hyperplasia and fibrocystic changes without evidence of malignancy. Postbiopsy clip films showed a coil-shaped clip in the upper inner quadrant of the left breast along the posterior/inferior aspect of the previously identified breast distortion. The patient states that after knowing about the abnormal mammogram she may have felt some thickening in this area of her breast but really nothing discrete. No nipple discharge or bleeding or crusting. She has had a hysterectomy but by lab work is premenopausal. She has a remote family history of a maternal cousin with breast cancer.   Other Problems Dorris Fetch, CMA; 11/05/2015 10:56 AM) Anxiety Disorder Bladder Problems Diverticulosis Gastric Ulcer Gastroesophageal Reflux Disease Seizure Disorder  Past Surgical History Dorris Fetch, CMA; 11/05/2015 10:56 AM) Breast  Biopsy Left. Hysterectomy (not due to cancer) - Complete Nissen Fundoplication  Diagnostic Studies History Dorris Fetch, CMA; 11/05/2015 10:56 AM) Colonoscopy 1-5 years ago Mammogram within last year Pap Smear >5 years ago  Allergies Dorris Fetch, CMA; 11/05/2015 10:57 AM) Cefdinir *CEPHALOSPORINS* Cetirizine HCl *ANTIHISTAMINES* Meperidine HCl *ANALGESICS - OPIOID* Morphine Sulfate (Concentrate) *ANALGESICS - OPIOID* Topiramate *ANTICONVULSANTS*  Medication History Dorris Fetch, CMA; 11/05/2015 10:58 AM) Taurine (500MG  Capsule, Oral) Active. Magnesium (200MG  Tablet, Oral) Active. Vitamin B6 (200MG  Tablet, Oral) Active. Allegra (180MG  Tablet, Oral) Active. Evening Primrose Oil (500MG  Capsule, Oral) Active. Medications Reconciled  Social History Dorris Fetch, CMA; 11/05/2015 10:56 AM) No alcohol use No drug use Tobacco use Never smoker.  Family History Dorris Fetch, CMA; 11/05/2015 10:56 AM) Cancer Father, Mother. Colon Polyps Father, Mother. Respiratory Condition Father.  Pregnancy / Birth History Dorris Fetch, CMA; 11/05/2015 10:56 AM) Age at menarche 13 years. Gravida 5 Maternal age 35-20 Para 2    Review of Systems Dorris Fetch CMA; 11/05/2015 10:56 AM) General Present- Fatigue and Night Sweats. Not Present- Appetite Loss, Chills, Fever, Weight Gain and Weight Loss. HEENT Present- Oral Ulcers, Seasonal Allergies, Sinus Pain and Wears glasses/contact lenses. Not Present- Earache, Hearing Loss, Hoarseness, Nose Bleed, Ringing in the Ears, Sore Throat, Visual Disturbances and Yellow Eyes. Breast Present- Breast Pain. Not Present- Breast Mass, Nipple Discharge and Skin Changes. Cardiovascular Present- Palpitations and Shortness of Breath. Not Present- Chest Pain, Difficulty Breathing Lying Down, Leg Cramps, Rapid Heart Rate and Swelling of Extremities. Endocrine Present- Hot flashes. Not Present- Cold Intolerance, Excessive Hunger, Hair  Changes, Heat Intolerance and New Diabetes.  Vitals Dorris Fetch CMA; 11/05/2015 10:59 AM) 11/05/2015 10:58 AM Weight: 127.8 lb Height: 62in Height was reported by patient. Body Surface Area: 1.58 m Body Mass Index: 23.37 kg/m  Temp.: 98.50F  Pulse: 68 (Regular)  BP: 110/72 (Sitting, Left Arm, Standard)       Physical Exam Destiny Salina T. Tonya Carlile MD; 11/05/2015 11:20 AM) The physical exam findings are as follows: Note:General: Healthy-appearing Caucasian female in no distress Skin: No rash or infection Lymph nodes: No cervical, supra clavicular or axillary nodes palpable Breasts: There is some thickening in the upper inner left breast at the biopsy site with tenderness. No skin changes or nipple crusting or inversion. No other palpable abnormalities. Lungs: No wheezing or increased work of breathing Neurologic: Alert and fully oriented. Affect normal.    Assessment & Plan Destiny Salina T. Zadyn Yardley MD; 11/05/2015 11:41 AM) ABNORMAL MAMMOGRAM OF LEFT BREAST (R92.8) Impression: Recent abnormal left breast mammogram with 1.7 cm area of architectural distortion. Status post core biopsy showing PA SH which is felt to be potentially discordant. Biopsy clip appears to be somewhat toward the edge of the abnormality which apparently was somewhat difficult to target due to location posterior along the chest wall. She has very dense breast tissue.  We discussed all the findings in detail. I discussed alternatives of proceeding with open excisional biopsy to rule out the small chance of malignancy (my recommendation) versus close follow-up with potential MRI and short-term imaging. They desire to proceed with excisional biopsy. We discussed the procedure including indications and nature and risks of anesthesia, bleeding, infection and potential need for further surgery if malignancy were found. All their questions were answered. Current Plans Pt Education - [facility] Breast Biopsy [facility]CI: discussed with  patient and provided information. Radioactive seed localized left breast lumpectomy as an outpatient under general anesthesia

## 2015-11-13 ENCOUNTER — Ambulatory Visit (HOSPITAL_BASED_OUTPATIENT_CLINIC_OR_DEPARTMENT_OTHER)
Admission: RE | Admit: 2015-11-13 | Discharge: 2015-11-13 | Disposition: A | Discharge status: Home / Self Care | Payer: BLUE CROSS/BLUE SHIELD | Source: Ambulatory Visit | Attending: General Surgery | Admitting: General Surgery

## 2015-11-13 ENCOUNTER — Encounter (HOSPITAL_BASED_OUTPATIENT_CLINIC_OR_DEPARTMENT_OTHER)
Admission: RE | Disposition: A | Discharge status: Home / Self Care | Payer: Self-pay | Source: Ambulatory Visit | Attending: General Surgery

## 2015-11-13 ENCOUNTER — Ambulatory Visit (HOSPITAL_BASED_OUTPATIENT_CLINIC_OR_DEPARTMENT_OTHER): Payer: BLUE CROSS/BLUE SHIELD | Admitting: Anesthesiology

## 2015-11-13 ENCOUNTER — Encounter (HOSPITAL_BASED_OUTPATIENT_CLINIC_OR_DEPARTMENT_OTHER): Payer: Self-pay | Admitting: *Deleted

## 2015-11-13 ENCOUNTER — Ambulatory Visit
Admission: RE | Admit: 2015-11-13 | Discharge: 2015-11-13 | Disposition: A | Discharge status: Home / Self Care | Payer: BLUE CROSS/BLUE SHIELD | Source: Ambulatory Visit | Attending: General Surgery | Admitting: General Surgery

## 2015-11-13 DIAGNOSIS — N6012 Diffuse cystic mastopathy of left breast: Secondary | ICD-10-CM | POA: Diagnosis not present

## 2015-11-13 DIAGNOSIS — D242 Benign neoplasm of left breast: Secondary | ICD-10-CM | POA: Diagnosis not present

## 2015-11-13 DIAGNOSIS — R921 Mammographic calcification found on diagnostic imaging of breast: Secondary | ICD-10-CM | POA: Diagnosis not present

## 2015-11-13 DIAGNOSIS — R928 Other abnormal and inconclusive findings on diagnostic imaging of breast: Secondary | ICD-10-CM

## 2015-11-13 DIAGNOSIS — N6489 Other specified disorders of breast: Secondary | ICD-10-CM | POA: Insufficient documentation

## 2015-11-13 HISTORY — DX: Anxiety disorder, unspecified: F41.9

## 2015-11-13 HISTORY — DX: Gastro-esophageal reflux disease without esophagitis: K21.9

## 2015-11-13 HISTORY — DX: Other complications of anesthesia, initial encounter: T88.59XA

## 2015-11-13 HISTORY — PX: BREAST EXCISIONAL BIOPSY: SUR124

## 2015-11-13 HISTORY — DX: Adverse effect of unspecified anesthetic, initial encounter: T41.45XA

## 2015-11-13 HISTORY — PX: BREAST LUMPECTOMY WITH RADIOACTIVE SEED LOCALIZATION: SHX6424

## 2015-11-13 SURGERY — BREAST LUMPECTOMY WITH RADIOACTIVE SEED LOCALIZATION
Anesthesia: General | Laterality: Left

## 2015-11-13 MED ORDER — FENTANYL CITRATE (PF) 100 MCG/2ML IJ SOLN
25.0000 ug | INTRAMUSCULAR | Status: DC | PRN
  Administered 2015-11-13 (×2): 25 ug via INTRAVENOUS
  Administered 2015-11-13: 50 ug via INTRAVENOUS

## 2015-11-13 MED ORDER — CHLORHEXIDINE GLUCONATE 4 % EX LIQD: 1.0000 "application " | Freq: Once | CUTANEOUS | Status: DC

## 2015-11-13 MED ORDER — METOCLOPRAMIDE HCL 5 MG/ML IJ SOLN
INTRAMUSCULAR | Status: AC
  Filled 2015-11-13: qty 2

## 2015-11-13 MED ORDER — VANCOMYCIN HCL 1000 MG IV SOLR
1000.0000 mg | INTRAVENOUS | Status: DC | PRN
  Administered 2015-11-13: 1000 mg via INTRAVENOUS

## 2015-11-13 MED ORDER — LIDOCAINE 2% (20 MG/ML) 5 ML SYRINGE
INTRAMUSCULAR | Status: DC | PRN
  Administered 2015-11-13: 80 mg via INTRAVENOUS

## 2015-11-13 MED ORDER — MIDAZOLAM HCL 2 MG/2ML IJ SOLN
INTRAMUSCULAR | Status: AC
  Filled 2015-11-13: qty 2

## 2015-11-13 MED ORDER — METOCLOPRAMIDE HCL 5 MG/ML IJ SOLN
INTRAMUSCULAR | Status: DC | PRN
  Administered 2015-11-13: 10 mg via INTRAVENOUS

## 2015-11-13 MED ORDER — PROMETHAZINE HCL 25 MG/ML IJ SOLN
INTRAMUSCULAR | Status: AC
  Filled 2015-11-13: qty 1

## 2015-11-13 MED ORDER — DEXAMETHASONE SODIUM PHOSPHATE 10 MG/ML IJ SOLN
INTRAMUSCULAR | Status: AC
  Filled 2015-11-13: qty 1

## 2015-11-13 MED ORDER — FENTANYL CITRATE (PF) 100 MCG/2ML IJ SOLN
INTRAMUSCULAR | Status: AC
  Filled 2015-11-13: qty 2

## 2015-11-13 MED ORDER — SCOPOLAMINE 1 MG/3DAYS TD PT72
MEDICATED_PATCH | TRANSDERMAL | Status: AC
  Filled 2015-11-13: qty 1

## 2015-11-13 MED ORDER — SCOPOLAMINE 1 MG/3DAYS TD PT72
1.0000 | MEDICATED_PATCH | Freq: Once | TRANSDERMAL | Status: DC | PRN
  Administered 2015-11-13: 1.5 mg via TRANSDERMAL

## 2015-11-13 MED ORDER — LACTATED RINGERS IV SOLN
INTRAVENOUS | Status: DC
  Administered 2015-11-13: 13:00:00 via INTRAVENOUS

## 2015-11-13 MED ORDER — PHENYLEPHRINE HCL 10 MG/ML IJ SOLN
INTRAMUSCULAR | Status: DC | PRN
  Administered 2015-11-13: 80 ug via INTRAVENOUS

## 2015-11-13 MED ORDER — HYDROCODONE-ACETAMINOPHEN 5-325 MG PO TABS: 1.0000 | ORAL_TABLET | ORAL | Status: DC | PRN

## 2015-11-13 MED ORDER — PROPOFOL 10 MG/ML IV BOLUS
INTRAVENOUS | Status: DC | PRN
  Administered 2015-11-13: 180 mg via INTRAVENOUS

## 2015-11-13 MED ORDER — METOCLOPRAMIDE HCL 5 MG/ML IJ SOLN
10.0000 mg | Freq: Once | INTRAMUSCULAR | Status: AC | PRN
  Administered 2015-11-13: 10 mg via INTRAVENOUS

## 2015-11-13 MED ORDER — PROMETHAZINE HCL 25 MG/ML IJ SOLN
6.2500 mg | Freq: Once | INTRAMUSCULAR | Status: AC
  Administered 2015-11-13: 6.25 mg via INTRAVENOUS

## 2015-11-13 MED ORDER — CIPROFLOXACIN IN D5W 400 MG/200ML IV SOLN: 400.0000 mg | INTRAVENOUS | Status: DC

## 2015-11-13 MED ORDER — FENTANYL CITRATE (PF) 100 MCG/2ML IJ SOLN
50.0000 ug | INTRAMUSCULAR | Status: DC | PRN
  Administered 2015-11-13: 100 ug via INTRAVENOUS

## 2015-11-13 MED ORDER — LACTATED RINGERS IV SOLN: INTRAVENOUS | Status: DC

## 2015-11-13 MED ORDER — PHENYLEPHRINE 40 MCG/ML (10ML) SYRINGE FOR IV PUSH (FOR BLOOD PRESSURE SUPPORT)
PREFILLED_SYRINGE | INTRAVENOUS | Status: AC
  Filled 2015-11-13: qty 10

## 2015-11-13 MED ORDER — ONDANSETRON HCL 4 MG/2ML IJ SOLN
INTRAMUSCULAR | Status: DC | PRN
  Administered 2015-11-13: 4 mg via INTRAVENOUS

## 2015-11-13 MED ORDER — VANCOMYCIN HCL IN DEXTROSE 1-5 GM/200ML-% IV SOLN
INTRAVENOUS | Status: AC
  Filled 2015-11-13: qty 200

## 2015-11-13 MED ORDER — LIDOCAINE 2% (20 MG/ML) 5 ML SYRINGE
INTRAMUSCULAR | Status: AC
  Filled 2015-11-13: qty 5

## 2015-11-13 MED ORDER — ONDANSETRON HCL 4 MG/2ML IJ SOLN
INTRAMUSCULAR | Status: AC
  Filled 2015-11-13: qty 2

## 2015-11-13 MED ORDER — GLYCOPYRROLATE 0.2 MG/ML IJ SOLN: 0.2000 mg | Freq: Once | INTRAMUSCULAR | Status: DC | PRN

## 2015-11-13 MED ORDER — BUPIVACAINE-EPINEPHRINE (PF) 0.25% -1:200000 IJ SOLN
INTRAMUSCULAR | Status: DC | PRN
  Administered 2015-11-13: 20 mL via PERINEURAL

## 2015-11-13 MED ORDER — DEXAMETHASONE SODIUM PHOSPHATE 4 MG/ML IJ SOLN
INTRAMUSCULAR | Status: DC | PRN
  Administered 2015-11-13: 10 mg via INTRAVENOUS

## 2015-11-13 MED ORDER — MIDAZOLAM HCL 2 MG/2ML IJ SOLN
1.0000 mg | INTRAMUSCULAR | Status: DC | PRN
  Administered 2015-11-13: 2 mg via INTRAVENOUS

## 2015-11-13 SURGICAL SUPPLY — 45 items
APPLIER CLIP 9.375 MED OPEN (MISCELLANEOUS)
BINDER BREAST LRG (GAUZE/BANDAGES/DRESSINGS) ×2 IMPLANT
BINDER BREAST MEDIUM (GAUZE/BANDAGES/DRESSINGS) IMPLANT
BINDER BREAST XLRG (GAUZE/BANDAGES/DRESSINGS) IMPLANT
BINDER BREAST XXLRG (GAUZE/BANDAGES/DRESSINGS) IMPLANT
BLADE SURG 15 STRL LF DISP TIS (BLADE) ×1 IMPLANT
BLADE SURG 15 STRL SS (BLADE) ×1
CANISTER SUC SOCK COL 7IN (MISCELLANEOUS) ×2 IMPLANT
CANISTER SUCT 1200ML W/VALVE (MISCELLANEOUS) IMPLANT
CHLORAPREP W/TINT 26ML (MISCELLANEOUS) ×2 IMPLANT
CLIP APPLIE 9.375 MED OPEN (MISCELLANEOUS) IMPLANT
CLIP TI WIDE RED SMALL 6 (CLIP) IMPLANT
COVER BACK TABLE 60X90IN (DRAPES) ×2 IMPLANT
COVER MAYO STAND STRL (DRAPES) ×2 IMPLANT
COVER PROBE W GEL 5X96 (DRAPES) ×2 IMPLANT
DECANTER SPIKE VIAL GLASS SM (MISCELLANEOUS) IMPLANT
DEVICE DUBIN W/COMP PLATE 8390 (MISCELLANEOUS) ×2 IMPLANT
DRAPE LAPAROSCOPIC ABDOMINAL (DRAPES) ×2 IMPLANT
DRAPE UTILITY XL STRL (DRAPES) ×2 IMPLANT
ELECT COATED BLADE 2.86 ST (ELECTRODE) ×2 IMPLANT
ELECT REM PT RETURN 9FT ADLT (ELECTROSURGICAL) ×2
ELECTRODE REM PT RTRN 9FT ADLT (ELECTROSURGICAL) ×1 IMPLANT
GLOVE BIOGEL PI IND STRL 8 (GLOVE) ×1 IMPLANT
GLOVE BIOGEL PI INDICATOR 8 (GLOVE) ×1
GLOVE ECLIPSE 7.5 STRL STRAW (GLOVE) ×2 IMPLANT
GOWN STRL REUS W/ TWL LRG LVL3 (GOWN DISPOSABLE) ×1 IMPLANT
GOWN STRL REUS W/ TWL XL LVL3 (GOWN DISPOSABLE) ×1 IMPLANT
GOWN STRL REUS W/TWL LRG LVL3 (GOWN DISPOSABLE) ×1
GOWN STRL REUS W/TWL XL LVL3 (GOWN DISPOSABLE) ×1
ILLUMINATOR WAVEGUIDE N/F (MISCELLANEOUS) ×2 IMPLANT
KIT MARKER MARGIN INK (KITS) ×2 IMPLANT
LIQUID BAND (GAUZE/BANDAGES/DRESSINGS) ×2 IMPLANT
NEEDLE HYPO 25X1 1.5 SAFETY (NEEDLE) ×2 IMPLANT
NS IRRIG 1000ML POUR BTL (IV SOLUTION) ×2 IMPLANT
PACK BASIN DAY SURGERY FS (CUSTOM PROCEDURE TRAY) ×2 IMPLANT
PENCIL BUTTON HOLSTER BLD 10FT (ELECTRODE) ×2 IMPLANT
SLEEVE SCD COMPRESS KNEE MED (MISCELLANEOUS) ×2 IMPLANT
SPONGE LAP 4X18 X RAY DECT (DISPOSABLE) ×2 IMPLANT
SUT MON AB 5-0 PS2 18 (SUTURE) ×2 IMPLANT
SUT VICRYL 3-0 CR8 SH (SUTURE) ×2 IMPLANT
SYR CONTROL 10ML LL (SYRINGE) ×2 IMPLANT
TOWEL OR 17X24 6PK STRL BLUE (TOWEL DISPOSABLE) ×2 IMPLANT
TOWEL OR NON WOVEN STRL DISP B (DISPOSABLE) ×2 IMPLANT
TUBE CONNECTING 20X1/4 (TUBING) ×2 IMPLANT
YANKAUER SUCT BULB TIP NO VENT (SUCTIONS) ×2 IMPLANT

## 2015-11-13 NOTE — Anesthesia Procedure Notes (Signed)
Procedure Name: LMA Insertion Date/Time: 11/13/2015 1:37 PM Performed by: Gar Gibbon Pre-anesthesia Checklist: Patient identified, Emergency Drugs available, Suction available and Patient being monitored Patient Re-evaluated:Patient Re-evaluated prior to inductionOxygen Delivery Method: Circle system utilized Preoxygenation: Pre-oxygenation with 100% oxygen Intubation Type: IV induction Ventilation: Mask ventilation without difficulty LMA: LMA inserted LMA Size: 4.0 Number of attempts: 1 Airway Equipment and Method: Bite block Placement Confirmation: positive ETCO2 Tube secured with: Tape Dental Injury: Teeth and Oropharynx as per pre-operative assessment

## 2015-11-13 NOTE — Interval H&P Note (Signed)
History and Physical Interval Note:  11/13/2015 1:21 PM  Destiny King  has presented today for surgery, with the diagnosis of abnormal mammogram with discordant biopsy  The various methods of treatment have been discussed with the patient and family. After consideration of risks, benefits and other options for treatment, the patient has consented to  Procedure(s): BREAST LUMPECTOMY WITH LEFT RADIOACTIVE SEED LOCALIZATION (Left) as a surgical intervention .  The patient's history has been reviewed, patient examined, no change in status, stable for surgery.  I have reviewed the patient's chart and labs.  Questions were answered to the patient's satisfaction.     Tuck Dulworth T

## 2015-11-13 NOTE — Discharge Instructions (Signed)
[facility] °Office Phone Number [phone] ° °BREAST BIOPSY/ PARTIAL MASTECTOMY: POST OP INSTRUCTIONS ° °Always review your discharge instruction sheet given to you by the facility where your surgery was performed. ° °IF YOU HAVE DISABILITY OR FAMILY LEAVE FORMS, YOU MUST BRING THEM TO THE OFFICE FOR PROCESSING.  DO NOT GIVE THEM TO YOUR DOCTOR. ° °1. A prescription for pain medication may be given to you upon discharge.  Take your pain medication as prescribed, if needed.  If narcotic pain medicine is not needed, then you may take acetaminophen (Tylenol) or ibuprofen (Advil) as needed. °2. Take your usually prescribed medications unless otherwise directed °3. If you need a refill on your pain medication, please contact your pharmacy.  They will contact our office to request authorization.  Prescriptions will not be filled after 5pm or on week-ends. °4. You should eat very light the first 24 hours after surgery, such as soup, crackers, pudding, etc.  Resume your normal diet the day after surgery. °5. Most patients will experience some swelling and bruising in the breast.  Ice packs and a good support bra will help.  Swelling and bruising can take several days to resolve.  °6. It is common to experience some constipation if taking pain medication after surgery.  Increasing fluid intake and taking a stool softener will usually help or prevent this problem from occurring.  A mild laxative (Milk of Magnesia or Miralax) should be taken according to package directions if there are no bowel movements after 48 hours. °7. Unless discharge instructions indicate otherwise, you may remove your bandages 24-48 hours after surgery, and you may shower at that time.  You may have steri-strips (small skin tapes) in place directly over the incision.  These strips should be left on the skin for 7-10 days.  If your surgeon used skin glue on the incision, you may shower in 24 hours.  The glue will flake off over the  next 2-3 weeks.  Any sutures or staples will be removed at the office during your follow-up visit. °8. ACTIVITIES:  You may resume regular daily activities (gradually increasing) beginning the next day.  Wearing a good support bra or sports bra minimizes pain and swelling.  You may have sexual intercourse when it is comfortable. °a. You may drive when you no longer are taking prescription pain medication, you can comfortably wear a seatbelt, and you can safely maneuver your car and apply brakes. °b. RETURN TO WORK:  ______________________________________________________________________________________ °9. You should see your doctor in the office for a follow-up appointment approximately two weeks after your surgery.  Your doctor’s nurse will typically make your follow-up appointment when she calls you with your pathology report.  Expect your pathology report 2-3 business days after your surgery.  You may call to check if you do not hear from us after three days. °10. OTHER INSTRUCTIONS: _______________________________________________________________________________________________ _____________________________________________________________________________________________________________________________________ °_____________________________________________________________________________________________________________________________________ °_____________________________________________________________________________________________________________________________________ ° °WHEN TO CALL YOUR DOCTOR: °1. Fever over 101.0 °2. Nausea and/or vomiting. °3. Extreme swelling or bruising. °4. Continued bleeding from incision. °5. Increased pain, redness, or drainage from the incision. ° °The clinic staff is available to answer your questions during regular business hours.  Please don’t hesitate to call and ask to speak to one of the nurses for clinical concerns.  If you have a medical emergency, go to the nearest  emergency room or call 911.  A surgeon from [facility] is always on call at the hospital. ° °For further questions, please visit [email or web address]  ° ° ° °  Post Anesthesia Home Care Instructions ° °Activity: °Get plenty of rest for the remainder of the day. A responsible adult should stay with you for 24 hours following the procedure.  °For the next 24 hours, DO NOT: °-Drive a car °-Operate machinery °-Drink alcoholic beverages °-Take any medication unless instructed by your physician °-Make any legal decisions or sign important papers. ° °Meals: °Start with liquid foods such as gelatin or soup. Progress to regular foods as tolerated. Avoid greasy, spicy, heavy foods. If nausea and/or vomiting occur, drink only clear liquids until the nausea and/or vomiting subsides. Call your physician if vomiting continues. ° °Special Instructions/Symptoms: °Your throat may feel dry or sore from the anesthesia or the breathing tube placed in your throat during surgery. If this causes discomfort, gargle with warm salt water. The discomfort should disappear within 24 hours. ° °If you had a scopolamine patch placed behind your ear for the management of post- operative nausea and/or vomiting: ° °1. The medication in the patch is effective for 72 hours, after which it should be removed.  Wrap patch in a tissue and discard in the trash. Wash hands thoroughly with soap and water. °2. You may remove the patch earlier than 72 hours if you experience unpleasant side effects which may include dry mouth, dizziness or visual disturbances. °3. Avoid touching the patch. Wash your hands with soap and water after contact with the patch. °  ° °

## 2015-11-13 NOTE — Transfer of Care (Signed)
Immediate Anesthesia Transfer of Care Note  Patient: Destiny King  Procedure(s) Performed: Procedure(s): BREAST LUMPECTOMY WITH LEFT RADIOACTIVE SEED LOCALIZATION (Left)  Patient Location: PACU  Anesthesia Type:General  Level of Consciousness: awake, sedated and patient cooperative  Airway & Oxygen Therapy: Patient Spontanous Breathing and Patient connected to face mask oxygen  Post-op Assessment: Report given to RN and Post -op Vital signs reviewed and stable  Post vital signs: Reviewed and stable  Last Vitals:  Filed Vitals:   11/13/15 1103  BP: 114/75  Pulse: 73  Temp: 36.9 C  Resp: 16    Last Pain: There were no vitals filed for this visit.    Patients Stated Pain Goal: 0 (11/13/15 1103)  Complications: No apparent anesthesia complications

## 2015-11-13 NOTE — Anesthesia Preprocedure Evaluation (Addendum)
Anesthesia Evaluation  Patient identified by MRN, date of birth, ID band Patient awake    Reviewed: Allergy & Precautions, NPO status , Patient's Chart, lab work & pertinent test results  Airway Mallampati: II  TM Distance: >3 FB Neck ROM: Full    Dental no notable dental hx.    Pulmonary neg pulmonary ROS,    Pulmonary exam normal breath sounds clear to auscultation       Cardiovascular negative cardio ROS Normal cardiovascular exam Rhythm:Regular Rate:Normal     Neuro/Psych Seizures -, Well Controlled,  negative psych ROS   GI/Hepatic Neg liver ROS, GERD  Controlled,  Endo/Other  negative endocrine ROS  Renal/GU negative Renal ROS  negative genitourinary   Musculoskeletal negative musculoskeletal ROS (+)   Abdominal   Peds negative pediatric ROS (+)  Hematology negative hematology ROS (+)   Anesthesia Other Findings   Reproductive/Obstetrics negative OB ROS                             Anesthesia Physical Anesthesia Plan  ASA: II  Anesthesia Plan: General   Post-op Pain Management:    Induction: Intravenous  Airway Management Planned: LMA  Additional Equipment:   Intra-op Plan:   Post-operative Plan:   Informed Consent: I have reviewed the patients History and Physical, chart, labs and discussed the procedure including the risks, benefits and alternatives for the proposed anesthesia with the patient or authorized representative who has indicated his/her understanding and acceptance.   Dental advisory given  Plan Discussed with: CRNA  Anesthesia Plan Comments:         Anesthesia Quick Evaluation

## 2015-11-13 NOTE — Op Note (Signed)
Preoperative Diagnosis: abnormal mammogram with discordant biopsy  Postoprative Diagnosis: abnormal mammogram with discordant biopsy  Procedure: Procedure(s): BREAST LUMPECTOMY WITH LEFT RADIOACTIVE SEED LOCALIZATION   Surgeon: Glenna Fellows T   Assistants: None  Anesthesia:  General LMA anesthesia  Indications: Patient is a 50 year old female wi abnormal mammogram with an area of architectural distortion upper inner quadrant. Large core biopsy revealed PAS H which was felt to be discordant and excisional biopsy was recommen This has been discussed with the Patient and she agrees to proceed.     Procedure Detail: Patient had previously undergone Placement at the marking clip site. The clip had previously been noted to Appear just inferior to the area of concern.  She was taken to the operating room, placed in the supine position on and general endotracheal anesthesia induced.  The left breast was widely Sterilely prepped and draped. She received preoperative IV antibiotics. Patient timeout was performed and correct procedure verified. The neoprobe was used to localize the seed in the upper inner left breast. A curvilinear incision was made at the areolar border and dissection carried into the subcutaneous tissue. A skin subcutaneous flap was raised superiorly and medially over the seed. Using the neoprobe for guidance the dissection was deepened down around the seed and a generous portion of tissue was excised around the seed taking more tissue superiorly based on the placement of the clip and seed relative to the lesion. This was taken down to the  Chest wall.  The specimen was removed and oriented.  Specimen x-ray was obtained showing the seed and marking clip well contained within the inferior portion of the specimen as planned. This was sent for permanent pathology. Complete hemostasis was assured. The lumpectomy site was marked with clips.The soft tissue was infiltrated with Marcaine.  Breast tissue was mobilized off the chest wall somewhat in all directions and the deep breast and subcutaneous tissues tissue closed with interrupted 3-0 Vicryl and skin with subcuticular 5-0 Monocryl and Liquiban. Sponge needle and instrument counts were correct.    Findings: As above  Estimated Blood Loss:  Minimal         Drains: none  Blood Given: none          Specimens: Left breast lumpectomy, oriented with suture        Complications:  * No complications entered in OR log *         Disposition: PACU - hemodynamically stable.         Condition: stable

## 2015-11-16 ENCOUNTER — Encounter (HOSPITAL_BASED_OUTPATIENT_CLINIC_OR_DEPARTMENT_OTHER): Payer: Self-pay | Admitting: General Surgery

## 2015-11-16 NOTE — Anesthesia Postprocedure Evaluation (Signed)
Anesthesia Post Note  Patient: Destiny King  Procedure(s) Performed: Procedure(s) (LRB): BREAST LUMPECTOMY WITH LEFT RADIOACTIVE SEED LOCALIZATION (Left)  Patient location during evaluation: PACU Anesthesia Type: General Level of consciousness: awake and alert and oriented Pain management: pain level controlled Vital Signs Assessment: post-procedure vital signs reviewed and stable Respiratory status: spontaneous breathing, nonlabored ventilation and respiratory function stable Cardiovascular status: blood pressure returned to baseline and stable Postop Assessment: no signs of nausea or vomiting Anesthetic complications: no     Last Vitals:  Filed Vitals:   11/13/15 1530 11/13/15 1620  BP: 106/63 97/54  Pulse: 69 64  Temp:  36.6 C  Resp: 11 14    Last Pain:  Filed Vitals:   11/13/15 1624  PainSc: 2    Pain Goal: Patients Stated Pain Goal: 0 (11/13/15 1103)               Rad Gramling A.

## 2015-12-10 DIAGNOSIS — Z6823 Body mass index (BMI) 23.0-23.9, adult: Secondary | ICD-10-CM | POA: Diagnosis not present

## 2015-12-10 DIAGNOSIS — Z1272 Encounter for screening for malignant neoplasm of vagina: Secondary | ICD-10-CM | POA: Diagnosis not present

## 2015-12-10 DIAGNOSIS — N951 Menopausal and female climacteric states: Secondary | ICD-10-CM | POA: Diagnosis not present

## 2015-12-10 DIAGNOSIS — Z01419 Encounter for gynecological examination (general) (routine) without abnormal findings: Secondary | ICD-10-CM | POA: Diagnosis not present

## 2015-12-14 ENCOUNTER — Ambulatory Visit (INDEPENDENT_AMBULATORY_CARE_PROVIDER_SITE_OTHER): Payer: BLUE CROSS/BLUE SHIELD | Admitting: Physician Assistant

## 2015-12-14 ENCOUNTER — Ambulatory Visit (HOSPITAL_BASED_OUTPATIENT_CLINIC_OR_DEPARTMENT_OTHER)
Admission: RE | Admit: 2015-12-14 | Discharge: 2015-12-14 | Disposition: A | Discharge status: Home / Self Care | Payer: BLUE CROSS/BLUE SHIELD | Source: Ambulatory Visit | Attending: Physician Assistant | Admitting: Physician Assistant

## 2015-12-14 ENCOUNTER — Encounter: Payer: Self-pay | Admitting: Physician Assistant

## 2015-12-14 ENCOUNTER — Telehealth: Payer: Self-pay

## 2015-12-14 VITALS — BP 128/79 | HR 72 | Wt 129.0 lb

## 2015-12-14 DIAGNOSIS — R0602 Shortness of breath: Secondary | ICD-10-CM | POA: Diagnosis not present

## 2015-12-14 DIAGNOSIS — Z9889 Other specified postprocedural states: Secondary | ICD-10-CM

## 2015-12-14 DIAGNOSIS — Z9223 Personal history of estrogen therapy: Secondary | ICD-10-CM | POA: Insufficient documentation

## 2015-12-14 DIAGNOSIS — R079 Chest pain, unspecified: Secondary | ICD-10-CM

## 2015-12-14 MED ORDER — IOPAMIDOL (ISOVUE-370) INJECTION 76%
100.0000 mL | Freq: Once | INTRAVENOUS | Status: AC | PRN
  Administered 2015-12-14: 100 mL via INTRAVENOUS

## 2015-12-14 MED ORDER — KETOROLAC TROMETHAMINE 60 MG/2ML IM SOLN
60.0000 mg | Freq: Once | INTRAMUSCULAR | Status: AC
  Administered 2015-12-14: 60 mg via INTRAMUSCULAR

## 2015-12-14 NOTE — Telephone Encounter (Signed)
Pt making an appointment to be seen.

## 2015-12-14 NOTE — Telephone Encounter (Signed)
There could be numerous causes. This could represent some inflammation of cartiledge of lung if coughing recently, could be musculoskelatal if strained during exercise or exertion this weekend, estrogen patch itself should not cause CP but estrogen is known to increase the likely hood of blood clots and those can travel to lungs. I suggest coming in to evaluate.

## 2015-12-14 NOTE — Progress Notes (Signed)
   Subjective:    Patient ID: Destiny King, female    DOB: 13-Feb-1966, 50 y.o.   MRN: [id number]  HPI  Pt is a 50 yo female who presents to the clinic with atypical chest pain that is worse when taking a deep breath that started yesterday. She started an estrogen patch 4 days ago and had a lumpectomy 1 month ago of her left breast. She denies any other trauma/injury. She denies any new physical activity. Tylenol is not making better. No fever, chills, n/v/d. She does feel some SOB but no cough. No sinus pressure, ST, ear pain.  .    Review of Systems  All other systems reviewed and are negative.      Objective:   Physical Exam  Constitutional: She is oriented to person, place, and time. She appears well-developed and well-nourished.  HENT:  Head: Normocephalic and atraumatic.  Cardiovascular: Normal rate, regular rhythm and normal heart sounds.   Pulmonary/Chest: Effort normal and breath sounds normal. She has no wheezes.  Tenderness to palpation over chest wall bilaterally and over sternum.   Incision healing over left breast with tape covering.   Neurological: She is alert and oriented to person, place, and time.  Psychiatric: She has a normal mood and affect. Her behavior is normal.          Assessment & Plan:  Atypical chest pain/pain with breathing/hx of estrogen replacement/post surgery- due to risk factors need to confirm no PE. CTA was ordered and negative for any acute causes for pain including PE.  EKG no acute findings, no arrthymias, no ST depression or elevation.  Toradol  given in office today. Suspect pain is musculoskelatal. Ice chest regularly. Sent mobic daily for next few days.

## 2015-12-15 ENCOUNTER — Other Ambulatory Visit (HOSPITAL_BASED_OUTPATIENT_CLINIC_OR_DEPARTMENT_OTHER): Payer: BLUE CROSS/BLUE SHIELD

## 2015-12-15 ENCOUNTER — Telehealth: Payer: Self-pay | Admitting: *Deleted

## 2015-12-15 MED ORDER — MELOXICAM 15 MG PO TABS: 15.0000 mg | ORAL_TABLET | Freq: Every day | ORAL | Status: DC

## 2015-12-15 NOTE — Telephone Encounter (Signed)
Per Lesly Rubenstein ok for patient to have meloxicam 15 mg 1 tab by mouth qd

## 2015-12-17 DIAGNOSIS — Z6823 Body mass index (BMI) 23.0-23.9, adult: Secondary | ICD-10-CM | POA: Diagnosis not present

## 2015-12-17 DIAGNOSIS — R319 Hematuria, unspecified: Secondary | ICD-10-CM | POA: Diagnosis not present

## 2016-01-27 ENCOUNTER — Encounter: Payer: Self-pay | Admitting: Physician Assistant

## 2016-01-29 ENCOUNTER — Ambulatory Visit (INDEPENDENT_AMBULATORY_CARE_PROVIDER_SITE_OTHER): Payer: BLUE CROSS/BLUE SHIELD | Admitting: Physician Assistant

## 2016-01-29 ENCOUNTER — Encounter: Payer: Self-pay | Admitting: Physician Assistant

## 2016-01-29 VITALS — BP 107/66 | HR 72 | Ht 62.0 in | Wt 129.0 lb

## 2016-01-29 DIAGNOSIS — R0989 Other specified symptoms and signs involving the circulatory and respiratory systems: Secondary | ICD-10-CM | POA: Insufficient documentation

## 2016-01-29 DIAGNOSIS — R350 Frequency of micturition: Secondary | ICD-10-CM

## 2016-01-29 DIAGNOSIS — R109 Unspecified abdominal pain: Secondary | ICD-10-CM | POA: Diagnosis not present

## 2016-01-29 DIAGNOSIS — R198 Other specified symptoms and signs involving the digestive system and abdomen: Secondary | ICD-10-CM

## 2016-01-29 DIAGNOSIS — R319 Hematuria, unspecified: Secondary | ICD-10-CM | POA: Diagnosis not present

## 2016-01-29 DIAGNOSIS — F458 Other somatoform disorders: Secondary | ICD-10-CM | POA: Diagnosis not present

## 2016-01-29 DIAGNOSIS — N3020 Other chronic cystitis without hematuria: Secondary | ICD-10-CM | POA: Diagnosis not present

## 2016-01-29 DIAGNOSIS — R09A2 Foreign body sensation, throat: Secondary | ICD-10-CM

## 2016-01-29 DIAGNOSIS — N951 Menopausal and female climacteric states: Secondary | ICD-10-CM

## 2016-01-29 HISTORY — DX: Other specified symptoms and signs involving the circulatory and respiratory systems: R09.89

## 2016-01-29 HISTORY — DX: Other chronic cystitis without hematuria: N30.20

## 2016-01-29 HISTORY — DX: Other specified symptoms and signs involving the digestive system and abdomen: R19.8

## 2016-01-29 HISTORY — DX: Foreign body sensation, throat: R09.A2

## 2016-01-29 LAB — POCT URINALYSIS DIPSTICK
Bilirubin, UA: NEGATIVE
Glucose, UA: NEGATIVE
Ketones, UA: NEGATIVE
Leukocytes, UA: NEGATIVE
Nitrite, UA: NEGATIVE
Protein, UA: NEGATIVE
Spec Grav, UA: 1.01
Urobilinogen, UA: 0.2
pH, UA: 7

## 2016-01-29 MED ORDER — AMBULATORY NON FORMULARY MEDICATION: 0 refills | Status: DC

## 2016-01-29 MED ORDER — PHENAZOPYRIDINE HCL 200 MG PO TABS: 200.0000 mg | ORAL_TABLET | Freq: Three times a day (TID) | ORAL | 0 refills | Status: AC

## 2016-01-29 NOTE — Patient Instructions (Signed)
pyridum for 2 days.  flonase for nasal congestion.  Consider allergy testing.

## 2016-01-31 LAB — URINE CULTURE: Organism ID, Bacteria: NO GROWTH

## 2016-01-31 NOTE — Progress Notes (Signed)
   Subjective:    Patient ID: Destiny King, female    DOB: [birth year], 50 y.o.   MRN: [id number]  HPI Pt is a 50 yo female with chronic cystitis and hematuria presents to the clinic with urinary frequency. No dysuria, abdominal pain, or flank pain. Not tried anything to make it better. No nausea or vomiting.   She continues to have hot flashes and mood swings. She went off vivelle because was not elminating symptoms. Now they are out of control. She wants to take more natural medicines for this. She would like her hormones checked.   She continues to have sensation of choking. Not choked on food. Evaluated by GI and stated globus sensation.    Review of Systems See HPI>     Objective:   Physical Exam  Constitutional: She is oriented to person, place, and time. She appears well-developed and well-nourished.  HENT:  Head: Normocephalic and atraumatic.  Cardiovascular: Normal rate, regular rhythm and normal heart sounds.   Pulmonary/Chest: Effort normal and breath sounds normal.  No CVA tenderness.   Abdominal: Soft. Bowel sounds are normal. She exhibits no distension and no mass. There is no tenderness. There is no rebound and no guarding.  Neurological: She is alert and oriented to person, place, and time.  Psychiatric: She has a normal mood and affect. Her behavior is normal.          Assessment & Plan:  Chronic cystitis/urinary frequency/hematuria- Results for orders placed or performed in visit on 01/29/16  Urine Culture  Result Value Ref Range   Organism ID, Bacteria NO GROWTH   POCT urinalysis dipstick  Result Value Ref Range   Color, UA light yellow    Clarity, UA clear    Glucose, UA neg    Bilirubin, UA neg    Ketones, UA neg    Spec Grav, UA 1.010    Blood, UA trace-lysed    pH, UA 7.0    Protein, UA neg    Urobilinogen, UA 0.2    Nitrite, UA neg    Leukocytes, UA Negative Negative  discussed with patient felt symptoms were more inflammation than  infection. Gave pyridum to use. Will confirm with culture and adjust meds accordingly.   Menopausal symptoms- went off vivelle. Ordered salvia testing via [facility]. Discussed SSRI. Pt declined today.   Globus sensation- reassured patient. She has been to GI for evaluation. U/S was negative.

## 2016-02-22 ENCOUNTER — Encounter: Payer: Self-pay | Admitting: Physician Assistant

## 2016-02-22 DIAGNOSIS — Z13220 Encounter for screening for lipoid disorders: Secondary | ICD-10-CM

## 2016-02-22 DIAGNOSIS — E010 Iodine-deficiency related diffuse (endemic) goiter: Secondary | ICD-10-CM

## 2016-02-22 DIAGNOSIS — Z862 Personal history of diseases of the blood and blood-forming organs and certain disorders involving the immune mechanism: Secondary | ICD-10-CM

## 2016-02-24 DIAGNOSIS — Z13220 Encounter for screening for lipoid disorders: Secondary | ICD-10-CM | POA: Diagnosis not present

## 2016-02-24 DIAGNOSIS — E049 Nontoxic goiter, unspecified: Secondary | ICD-10-CM | POA: Diagnosis not present

## 2016-02-24 DIAGNOSIS — Z862 Personal history of diseases of the blood and blood-forming organs and certain disorders involving the immune mechanism: Secondary | ICD-10-CM | POA: Diagnosis not present

## 2016-02-25 LAB — LIPID PANEL
Cholesterol: 153 mg/dL (ref 125–200)
HDL: 78 mg/dL (ref >=46)
LDL Cholesterol: 63 mg/dL (ref <130)
Total CHOL/HDL Ratio: 2 Ratio (ref <=5.0)
Triglycerides: 59 mg/dL (ref <150)
VLDL: 12 mg/dL (ref <30)

## 2016-02-25 LAB — COMPLETE METABOLIC PANEL WITH GFR
ALT: 26 U/L (ref 6–29)
AST: 25 U/L (ref 10–35)
Albumin: 4.2 g/dL (ref 3.6–5.1)
Alkaline Phosphatase: 55 U/L (ref 33–115)
BUN: 12 mg/dL (ref 7–25)
CO2: 29 mmol/L (ref 20–31)
Calcium: 8.8 mg/dL (ref 8.6–10.2)
Chloride: 104 mmol/L (ref 98–110)
Creat: 0.72 mg/dL (ref 0.50–1.10)
GFR, Est African American: >89 mL/min (ref >=60)
GFR, Est Non African American: >89 mL/min (ref >=60)
Glucose, Bld: 89 mg/dL (ref 65–99)
Potassium: 4.4 mmol/L (ref 3.5–5.3)
Sodium: 141 mmol/L (ref 135–146)
Total Bilirubin: 0.6 mg/dL (ref 0.2–1.2)
Total Protein: 6.1 g/dL (ref 6.1–8.1)

## 2016-02-25 LAB — CBC WITH DIFFERENTIAL/PLATELET
Basophils Absolute: 42 cells/uL (ref 0–200)
Basophils Relative: 1 %
Eosinophils Absolute: 84 cells/uL (ref 15–500)
Eosinophils Relative: 2 %
HCT: 38.8 % (ref 35.0–45.0)
Hemoglobin: 12.9 g/dL (ref 11.7–15.5)
Lymphocytes Relative: 39 %
Lymphs Abs: 1638 cells/uL (ref 850–3900)
MCH: 31.5 pg (ref 27.0–33.0)
MCHC: 33.2 g/dL (ref 32.0–36.0)
MCV: 94.9 fL (ref 80.0–100.0)
MPV: 10.9 fL (ref 7.5–12.5)
Monocytes Absolute: 336 cells/uL (ref 200–950)
Monocytes Relative: 8 %
Neutro Abs: 2100 cells/uL (ref 1500–7800)
Neutrophils Relative %: 50 %
Platelets: 202 10*3/uL (ref 140–400)
RBC: 4.09 MIL/uL (ref 3.80–5.10)
RDW: 12.4 % (ref 11.0–15.0)
WBC: 4.2 10*3/uL (ref 3.8–10.8)

## 2016-02-25 LAB — TSH: TSH: 1.34 mIU/L

## 2016-03-30 ENCOUNTER — Encounter: Payer: Self-pay | Admitting: Physician Assistant

## 2016-03-30 ENCOUNTER — Ambulatory Visit (INDEPENDENT_AMBULATORY_CARE_PROVIDER_SITE_OTHER): Payer: BLUE CROSS/BLUE SHIELD | Admitting: Physician Assistant

## 2016-03-30 VITALS — BP 121/79 | HR 71 | Temp 98.6°F | Ht 62.0 in | Wt 128.0 lb

## 2016-03-30 DIAGNOSIS — R109 Unspecified abdominal pain: Secondary | ICD-10-CM | POA: Diagnosis not present

## 2016-03-30 DIAGNOSIS — R29898 Other symptoms and signs involving the musculoskeletal system: Secondary | ICD-10-CM | POA: Diagnosis not present

## 2016-03-30 DIAGNOSIS — G40A09 Absence epileptic syndrome, not intractable, without status epilepticus: Secondary | ICD-10-CM

## 2016-03-30 NOTE — Progress Notes (Signed)
Subjective:    Patient ID: Destiny King, female    DOB: Jun 29, 1965, 50 y.o.   MRN: [id number]  HPI  Pt is a 50 yo female who presents to the clinic with multiple concerns today.   She is having more generalized upper abdominal pain. She admits she is not taking her PPI. She has a hx of h.pylori and took 2 rounds of abx to treat. She states "her stomach feels raw like it did when she had h.pylori". She denies any melena or hematochezia. Food does aggravate symptoms. Not tried anything to make better. She denies any CP or palpitations.   She is more concerned with an episode of right arm cramping 5 days ago. She was in the grocery store and complains of sudden right arm cramp and weakness that left her not able to use her left arm. She states cramp started around shoulder and went down laterally into hands. She denies any facial drooping, slurred speech, confusion, headache. She does feel weak in her right thigh as well. She does not she has been dropping things with her right hand for months. She is right handed. Slowly symptoms improved and completely resolved after 24 hours. This is first episode. She denies any neck pain, numbness or tingling, or CP. She has not had any recent surguries, no hormones, no recent travel, or no hx of DVT/PE. She denies any trauma or recent injury.     Review of Systems See HPI.     Objective:   Physical Exam  Constitutional: She is oriented to person, place, and time. She appears well-developed and well-nourished.  HENT:  Head: Normocephalic and atraumatic.  Eyes: Conjunctivae are normal. Right eye exhibits no discharge. Left eye exhibits no discharge.  Neck: Normal range of motion. Neck supple.  Cardiovascular: Normal rate, regular rhythm and normal heart sounds.   Pulmonary/Chest: Effort normal and breath sounds normal. She has no wheezes.  Abdominal: Soft. Bowel sounds are normal. She exhibits no distension and no mass. There is no tenderness. There is  no rebound and no guarding.  Musculoskeletal:  NROM of neck.  No tenderness to palpation over c-spine.  Upper and lower extremities 5/5 strength.  No tenderness to palpation over head of humerus and bicep tendon insertion.   Lymphadenopathy:    She has no cervical adenopathy.  Neurological: She is alert and oriented to person, place, and time. No cranial nerve deficit. Coordination normal.  Psychiatric: She has a normal mood and affect. Her behavior is normal.          Assessment & Plan:  [name] KitchenMarland KitchenDiagnoses and all orders for this visit:  Abdominal pain, unspecified abdominal location -     H. pylori breath test  Absence seizure [facility]) -     Ambulatory referral to Neurology  Right arm weakness -     Ambulatory referral to Neurology  Right leg weakness -     Ambulatory referral to Neurology  due to hx and worsening symptoms will repeat h.pylori breath test. If normal suggest patient go back on PPI and see if symptoms improving in next 2 weeks. If not I would follow back up with GI.   Absence seizure/right arm weakness/right leg weakness- unclear etiology. Symptoms are concerning for stroke however she is not having symptoms anymore and 5 days since presentation of initial symptoms.  Lipid panel up to date and looks great.  Last EKG done this year and looked great.  BP controlled.  Pt does not have hx of  DVT or PE. She does not have any increased risk factors.  Discuss possible cramp pt is on magnesium and she states she drinks lots of water.  Unlikely cardiac but will order stress test to fully evaluate.  Will make referral to neurology due to hx of seizure and make sure she is not having some seizure activity.

## 2016-04-01 ENCOUNTER — Other Ambulatory Visit: Payer: Self-pay | Admitting: *Deleted

## 2016-04-01 LAB — H. PYLORI BREATH TEST: H. pylori Breath Test: NOT DETECTED

## 2016-04-01 MED ORDER — OMEPRAZOLE 40 MG PO CPDR: 40.0000 mg | DELAYED_RELEASE_CAPSULE | Freq: Every day | ORAL | 3 refills | Status: DC

## 2016-04-01 NOTE — Progress Notes (Signed)
Call pt: h.pylori is NEG.

## 2016-04-04 ENCOUNTER — Telehealth: Payer: Self-pay | Admitting: Physician Assistant

## 2016-04-04 ENCOUNTER — Encounter: Payer: Self-pay | Admitting: Physician Assistant

## 2016-04-04 DIAGNOSIS — R29898 Other symptoms and signs involving the musculoskeletal system: Secondary | ICD-10-CM | POA: Insufficient documentation

## 2016-04-04 HISTORY — DX: Other symptoms and signs involving the musculoskeletal system: R29.898

## 2016-04-15 DIAGNOSIS — M542 Cervicalgia: Secondary | ICD-10-CM | POA: Diagnosis not present

## 2016-04-15 DIAGNOSIS — G609 Hereditary and idiopathic neuropathy, unspecified: Secondary | ICD-10-CM | POA: Diagnosis not present

## 2016-04-15 DIAGNOSIS — M5412 Radiculopathy, cervical region: Secondary | ICD-10-CM | POA: Diagnosis not present

## 2016-04-15 DIAGNOSIS — G603 Idiopathic progressive neuropathy: Secondary | ICD-10-CM | POA: Diagnosis not present

## 2016-04-21 ENCOUNTER — Encounter: Payer: Self-pay | Admitting: Physician Assistant

## 2016-04-21 DIAGNOSIS — M542 Cervicalgia: Secondary | ICD-10-CM | POA: Diagnosis not present

## 2016-04-21 DIAGNOSIS — G40A09 Absence epileptic syndrome, not intractable, without status epilepticus: Secondary | ICD-10-CM | POA: Diagnosis not present

## 2016-04-21 DIAGNOSIS — M79601 Pain in right arm: Secondary | ICD-10-CM | POA: Diagnosis not present

## 2016-04-21 DIAGNOSIS — M62830 Muscle spasm of back: Secondary | ICD-10-CM | POA: Diagnosis not present

## 2016-04-21 DIAGNOSIS — G603 Idiopathic progressive neuropathy: Secondary | ICD-10-CM | POA: Diagnosis not present

## 2016-04-21 DIAGNOSIS — M79602 Pain in left arm: Secondary | ICD-10-CM | POA: Diagnosis not present

## 2016-04-25 ENCOUNTER — Encounter: Payer: Self-pay | Admitting: Physician Assistant

## 2016-04-25 DIAGNOSIS — G40201 Localization-related (focal) (partial) symptomatic epilepsy and epileptic syndromes with complex partial seizures, not intractable, with status epilepticus: Secondary | ICD-10-CM | POA: Diagnosis not present

## 2016-04-25 DIAGNOSIS — R202 Paresthesia of skin: Secondary | ICD-10-CM | POA: Diagnosis not present

## 2016-04-25 DIAGNOSIS — M545 Low back pain: Secondary | ICD-10-CM | POA: Diagnosis not present

## 2016-05-09 DIAGNOSIS — H52223 Regular astigmatism, bilateral: Secondary | ICD-10-CM | POA: Diagnosis not present

## 2016-05-09 DIAGNOSIS — H5213 Myopia, bilateral: Secondary | ICD-10-CM | POA: Diagnosis not present

## 2016-05-12 DIAGNOSIS — M79601 Pain in right arm: Secondary | ICD-10-CM | POA: Diagnosis not present

## 2016-05-12 DIAGNOSIS — H5712 Ocular pain, left eye: Secondary | ICD-10-CM | POA: Diagnosis not present

## 2016-05-12 DIAGNOSIS — G40201 Localization-related (focal) (partial) symptomatic epilepsy and epileptic syndromes with complex partial seizures, not intractable, with status epilepticus: Secondary | ICD-10-CM | POA: Diagnosis not present

## 2016-05-12 DIAGNOSIS — R202 Paresthesia of skin: Secondary | ICD-10-CM | POA: Diagnosis not present

## 2016-05-12 DIAGNOSIS — M79602 Pain in left arm: Secondary | ICD-10-CM | POA: Diagnosis not present

## 2016-05-17 DIAGNOSIS — G500 Trigeminal neuralgia: Secondary | ICD-10-CM | POA: Diagnosis not present

## 2016-05-17 DIAGNOSIS — M542 Cervicalgia: Secondary | ICD-10-CM | POA: Diagnosis not present

## 2016-05-18 ENCOUNTER — Encounter: Payer: Self-pay | Admitting: Physician Assistant

## 2016-05-18 DIAGNOSIS — H5712 Ocular pain, left eye: Secondary | ICD-10-CM | POA: Insufficient documentation

## 2016-05-18 HISTORY — DX: Ocular pain, left eye: H57.12

## 2016-05-24 ENCOUNTER — Other Ambulatory Visit: Payer: Self-pay | Admitting: Physician Assistant

## 2016-05-24 DIAGNOSIS — R29898 Other symptoms and signs involving the musculoskeletal system: Secondary | ICD-10-CM

## 2016-06-03 DIAGNOSIS — M5417 Radiculopathy, lumbosacral region: Secondary | ICD-10-CM | POA: Diagnosis not present

## 2016-06-03 DIAGNOSIS — R5383 Other fatigue: Secondary | ICD-10-CM | POA: Diagnosis not present

## 2016-06-03 DIAGNOSIS — R202 Paresthesia of skin: Secondary | ICD-10-CM | POA: Diagnosis not present

## 2016-06-03 DIAGNOSIS — G40201 Localization-related (focal) (partial) symptomatic epilepsy and epileptic syndromes with complex partial seizures, not intractable, with status epilepticus: Secondary | ICD-10-CM | POA: Diagnosis not present

## 2016-06-03 DIAGNOSIS — G609 Hereditary and idiopathic neuropathy, unspecified: Secondary | ICD-10-CM | POA: Diagnosis not present

## 2016-06-28 DIAGNOSIS — R930 Abnormal findings on diagnostic imaging of skull and head, not elsewhere classified: Secondary | ICD-10-CM | POA: Diagnosis not present

## 2016-06-28 DIAGNOSIS — M79601 Pain in right arm: Secondary | ICD-10-CM | POA: Diagnosis not present

## 2016-06-28 DIAGNOSIS — M79602 Pain in left arm: Secondary | ICD-10-CM | POA: Diagnosis not present

## 2016-06-28 DIAGNOSIS — G9389 Other specified disorders of brain: Secondary | ICD-10-CM | POA: Diagnosis not present

## 2016-06-28 DIAGNOSIS — R202 Paresthesia of skin: Secondary | ICD-10-CM | POA: Diagnosis not present

## 2016-06-28 DIAGNOSIS — R839 Unspecified abnormal finding in cerebrospinal fluid: Secondary | ICD-10-CM | POA: Diagnosis not present

## 2016-06-28 DIAGNOSIS — R5383 Other fatigue: Secondary | ICD-10-CM | POA: Diagnosis not present

## 2016-07-04 DIAGNOSIS — R202 Paresthesia of skin: Secondary | ICD-10-CM | POA: Diagnosis not present

## 2016-07-04 DIAGNOSIS — R930 Abnormal findings on diagnostic imaging of skull and head, not elsewhere classified: Secondary | ICD-10-CM | POA: Diagnosis not present

## 2016-07-04 DIAGNOSIS — G971 Other reaction to spinal and lumbar puncture: Secondary | ICD-10-CM | POA: Diagnosis not present

## 2016-07-04 DIAGNOSIS — R5383 Other fatigue: Secondary | ICD-10-CM | POA: Diagnosis not present

## 2016-07-19 DIAGNOSIS — G971 Other reaction to spinal and lumbar puncture: Secondary | ICD-10-CM | POA: Diagnosis not present

## 2016-07-27 DIAGNOSIS — R202 Paresthesia of skin: Secondary | ICD-10-CM | POA: Insufficient documentation

## 2016-07-27 DIAGNOSIS — G40909 Epilepsy, unspecified, not intractable, without status epilepticus: Secondary | ICD-10-CM | POA: Diagnosis not present

## 2016-07-27 DIAGNOSIS — R200 Anesthesia of skin: Secondary | ICD-10-CM

## 2016-07-27 DIAGNOSIS — M542 Cervicalgia: Secondary | ICD-10-CM

## 2016-07-27 DIAGNOSIS — R9082 White matter disease, unspecified: Secondary | ICD-10-CM

## 2016-07-27 HISTORY — DX: Cervicalgia: M54.2

## 2016-07-27 HISTORY — DX: White matter disease, unspecified: R90.82

## 2016-07-27 HISTORY — DX: Paresthesia of skin: R20.0

## 2016-07-27 HISTORY — DX: Paresthesia of skin: R20.2

## 2016-08-12 DIAGNOSIS — R5383 Other fatigue: Secondary | ICD-10-CM | POA: Diagnosis not present

## 2016-08-12 DIAGNOSIS — R930 Abnormal findings on diagnostic imaging of skull and head, not elsewhere classified: Secondary | ICD-10-CM | POA: Diagnosis not present

## 2016-08-12 DIAGNOSIS — R202 Paresthesia of skin: Secondary | ICD-10-CM | POA: Diagnosis not present

## 2016-08-22 NOTE — Telephone Encounter (Signed)
error 

## 2016-10-18 ENCOUNTER — Other Ambulatory Visit: Payer: Self-pay | Admitting: Physician Assistant

## 2016-10-18 ENCOUNTER — Ambulatory Visit (INDEPENDENT_AMBULATORY_CARE_PROVIDER_SITE_OTHER): Payer: BLUE CROSS/BLUE SHIELD

## 2016-10-18 DIAGNOSIS — Z1239 Encounter for other screening for malignant neoplasm of breast: Secondary | ICD-10-CM

## 2016-10-18 DIAGNOSIS — Z1231 Encounter for screening mammogram for malignant neoplasm of breast: Secondary | ICD-10-CM

## 2016-10-21 DIAGNOSIS — G40109 Localization-related (focal) (partial) symptomatic epilepsy and epileptic syndromes with simple partial seizures, not intractable, without status epilepticus: Secondary | ICD-10-CM | POA: Diagnosis not present

## 2016-10-21 DIAGNOSIS — G35 Multiple sclerosis: Secondary | ICD-10-CM | POA: Diagnosis not present

## 2016-10-21 DIAGNOSIS — R5383 Other fatigue: Secondary | ICD-10-CM | POA: Diagnosis not present

## 2016-10-21 DIAGNOSIS — R930 Abnormal findings on diagnostic imaging of skull and head, not elsewhere classified: Secondary | ICD-10-CM | POA: Diagnosis not present

## 2016-10-24 DIAGNOSIS — Z79899 Other long term (current) drug therapy: Secondary | ICD-10-CM | POA: Diagnosis not present

## 2016-10-24 DIAGNOSIS — W19XXXA Unspecified fall, initial encounter: Secondary | ICD-10-CM | POA: Diagnosis not present

## 2016-10-24 DIAGNOSIS — Y92009 Unspecified place in unspecified non-institutional (private) residence as the place of occurrence of the external cause: Secondary | ICD-10-CM | POA: Diagnosis not present

## 2016-10-24 DIAGNOSIS — R569 Unspecified convulsions: Secondary | ICD-10-CM | POA: Diagnosis not present

## 2016-10-24 DIAGNOSIS — G40802 Other epilepsy, not intractable, without status epilepticus: Secondary | ICD-10-CM | POA: Diagnosis not present

## 2016-10-24 DIAGNOSIS — R410 Disorientation, unspecified: Secondary | ICD-10-CM | POA: Diagnosis not present

## 2016-10-24 DIAGNOSIS — G40909 Epilepsy, unspecified, not intractable, without status epilepticus: Secondary | ICD-10-CM | POA: Diagnosis not present

## 2016-10-25 NOTE — Progress Notes (Signed)
Call pt: normal mammogram. Follow up in 1 year.

## 2016-10-26 DIAGNOSIS — R9431 Abnormal electrocardiogram [ECG] [EKG]: Secondary | ICD-10-CM | POA: Diagnosis not present

## 2016-10-29 DIAGNOSIS — R569 Unspecified convulsions: Secondary | ICD-10-CM | POA: Diagnosis not present

## 2016-11-04 DIAGNOSIS — G40909 Epilepsy, unspecified, not intractable, without status epilepticus: Secondary | ICD-10-CM | POA: Diagnosis not present

## 2016-11-04 DIAGNOSIS — R930 Abnormal findings on diagnostic imaging of skull and head, not elsewhere classified: Secondary | ICD-10-CM | POA: Diagnosis not present

## 2016-11-04 DIAGNOSIS — R202 Paresthesia of skin: Secondary | ICD-10-CM | POA: Diagnosis not present

## 2016-11-04 DIAGNOSIS — R200 Anesthesia of skin: Secondary | ICD-10-CM | POA: Diagnosis not present

## 2016-11-12 DIAGNOSIS — M48061 Spinal stenosis, lumbar region without neurogenic claudication: Secondary | ICD-10-CM | POA: Diagnosis not present

## 2016-11-14 DIAGNOSIS — G8929 Other chronic pain: Secondary | ICD-10-CM

## 2016-11-14 DIAGNOSIS — M545 Low back pain, unspecified: Secondary | ICD-10-CM

## 2016-11-14 HISTORY — DX: Low back pain, unspecified: M54.50

## 2016-11-14 HISTORY — DX: Other chronic pain: G89.29

## 2016-12-01 DIAGNOSIS — G379 Demyelinating disease of central nervous system, unspecified: Secondary | ICD-10-CM | POA: Diagnosis not present

## 2016-12-01 DIAGNOSIS — H5712 Ocular pain, left eye: Secondary | ICD-10-CM | POA: Diagnosis not present

## 2016-12-02 DIAGNOSIS — G379 Demyelinating disease of central nervous system, unspecified: Secondary | ICD-10-CM | POA: Insufficient documentation

## 2016-12-02 HISTORY — DX: Demyelinating disease of central nervous system, unspecified: G37.9

## 2016-12-29 DIAGNOSIS — H5712 Ocular pain, left eye: Secondary | ICD-10-CM | POA: Diagnosis not present

## 2016-12-29 DIAGNOSIS — R9082 White matter disease, unspecified: Secondary | ICD-10-CM | POA: Diagnosis not present

## 2017-03-06 DIAGNOSIS — H5712 Ocular pain, left eye: Secondary | ICD-10-CM | POA: Diagnosis not present

## 2017-03-06 DIAGNOSIS — M50321 Other cervical disc degeneration at C4-C5 level: Secondary | ICD-10-CM | POA: Diagnosis not present

## 2017-03-06 DIAGNOSIS — S299XXA Unspecified injury of thorax, initial encounter: Secondary | ICD-10-CM | POA: Diagnosis not present

## 2017-03-06 DIAGNOSIS — M47892 Other spondylosis, cervical region: Secondary | ICD-10-CM | POA: Diagnosis not present

## 2017-03-06 DIAGNOSIS — G35 Multiple sclerosis: Secondary | ICD-10-CM | POA: Diagnosis not present

## 2017-03-23 DIAGNOSIS — G379 Demyelinating disease of central nervous system, unspecified: Secondary | ICD-10-CM | POA: Diagnosis not present

## 2017-03-28 ENCOUNTER — Ambulatory Visit: Payer: BLUE CROSS/BLUE SHIELD | Admitting: Physician Assistant

## 2017-03-29 ENCOUNTER — Telehealth: Payer: Self-pay | Admitting: Physician Assistant

## 2017-03-29 NOTE — Telephone Encounter (Addendum)
Pt called. She states for about 8 days she had body aches, fever then she came down with a cough then out of nowhere she got nauseous,throwing up, diarrhea. She does not want to come in because she is feeling so bad. She states Lesly Rubenstein knows her very well and wants her to write her a prescrption. I spoke with Kelsi/Triage Nurse who advised that pt come in.  Thank you.

## 2017-03-30 ENCOUNTER — Encounter: Payer: Self-pay | Admitting: *Deleted

## 2017-03-30 ENCOUNTER — Emergency Department (INDEPENDENT_AMBULATORY_CARE_PROVIDER_SITE_OTHER): Payer: BLUE CROSS/BLUE SHIELD

## 2017-03-30 ENCOUNTER — Emergency Department (INDEPENDENT_AMBULATORY_CARE_PROVIDER_SITE_OTHER)
Admission: EM | Admit: 2017-03-30 | Discharge: 2017-03-30 | Disposition: A | Discharge status: Home / Self Care | Payer: BLUE CROSS/BLUE SHIELD | Source: Home / Self Care | Attending: Family Medicine | Admitting: Family Medicine

## 2017-03-30 DIAGNOSIS — M222X2 Patellofemoral disorders, left knee: Secondary | ICD-10-CM

## 2017-03-30 DIAGNOSIS — R05 Cough: Secondary | ICD-10-CM | POA: Diagnosis not present

## 2017-03-30 DIAGNOSIS — M222X1 Patellofemoral disorders, right knee: Secondary | ICD-10-CM

## 2017-03-30 DIAGNOSIS — R112 Nausea with vomiting, unspecified: Secondary | ICD-10-CM

## 2017-03-30 DIAGNOSIS — R197 Diarrhea, unspecified: Secondary | ICD-10-CM

## 2017-03-30 DIAGNOSIS — R053 Chronic cough: Secondary | ICD-10-CM

## 2017-03-30 LAB — POCT CBC W AUTO DIFF (K'VILLE URGENT CARE)

## 2017-03-30 LAB — POCT INFLUENZA A/B
Influenza A, POC: NEGATIVE
Influenza B, POC: NEGATIVE

## 2017-03-30 MED ORDER — AZITHROMYCIN 250 MG PO TABS: ORAL_TABLET | ORAL | 0 refills | Status: DC

## 2017-03-30 NOTE — Telephone Encounter (Signed)
Spoke with Pt, she advised she was feeling better today. She denied Rx for zofran. Pt did schedule an appointment with PCP for routine follow up. Advised to contact clinic if she is still symptomatic so we can get her in sooner for an acute visit. Verbalized understanding.

## 2017-03-30 NOTE — Telephone Encounter (Signed)
Those are very vague symptoms that sound mostly viral but could be developing into something more serious. I can send her something in for nausea but unless I look at her/listen to lungs/feel belly I could be missing something. Keep BRAT diet. Tylenol and ibuprofen for body aches.   Ok to send zofran 8mg  one tablet as needed every 6-8 hours #20

## 2017-03-30 NOTE — Discharge Instructions (Signed)
Take plain guaifenesin (1200mg  extended release tabs such as Mucinex) twice daily, with plenty of water, for cough and congestion. Get adequate rest.   May take Delsym Cough Suppressant at bedtime for nighttime cough.  Stop all antihistamines for now, and other non-prescription cough/cold preparations.   Begin clear liquids (Pedialyte while having diarrhea) until improved, then advance to a [employer] (Bananas, Rice, Applesauce, Toast).  Then gradually resume a regular diet when tolerated.  Avoid milk products until well.    If symptoms become significantly worse during the night or over the weekend, proceed to the local emergency room.   Begin knee exercises as per instruction sheet.

## 2017-03-30 NOTE — ED Triage Notes (Signed)
Patient reports having the flu 03/06/17. She later developed a dry cough that has persisted. She c/o 3 days of vomiting and watery diarrhea with urgency.

## 2017-03-30 NOTE — Telephone Encounter (Signed)
Thank you :)

## 2017-03-30 NOTE — ED Provider Notes (Signed)
[facility]    CSN: [id number] Arrival date & time: 03/30/17  1409     History   Chief Complaint Chief Complaint  Patient presents with  . Cough  . Diarrhea  . Emesis    HPI Destiny King is a 51 y.o. female.   Patient presents with three problems: 1)  About 3 weeks ago she developed flu-like illness with chills/sweats, myalgias, and fatigue, followed by a non-productive cough two weeks ago.  The cough has persisted and she now has wheezing and shortness of breath with activity.  2)   During the past three days she has had nausea/vomiting and watery diarrhea, and recurrent chills/sweats. 3)  She complains of several month history of "popping" knees when extending both knees, especially when walking up stairs.  No history of knee injury.  She denies pain with knee extension.   The history is provided by the patient.    Past Medical History:  Diagnosis Date  . Anxiety   . Complication of anesthesia    states she is very sensitive to "sedating" drugs  . GERD (gastroesophageal reflux disease)   . Seizures [facility](HCC)    as young child    Patient Active Problem List   Diagnosis Date Noted  . Left eye pain 05/18/2016  . Right arm weakness 04/04/2016  . Right leg weakness 04/04/2016  . Globus sensation 01/29/2016  . Chronic cystitis 01/29/2016  . Hematuria 10/27/2015  . Abnormal mammogram of left breast 10/23/2015  . Menopausal symptoms 10/12/2015  . Allergic rhinitis due to pollen 03/06/2015  . Thyromegaly 03/06/2015  . Other fatigue 03/06/2015  . Difficulty swallowing 03/06/2015  . Seasonal allergies 03/06/2015  . Dense breast tissue 08/26/2014  . Breast cancer screening 08/26/2014  . Peptic ulcer disease 07/01/2014  . H. pylori infection 02/04/2014  . Diverticulosis 02/04/2014  . Rectal bleeding 01/12/2014  . Change in stool 01/12/2014  . Generalized convulsive epilepsy (HCC) 02/14/2011  . Absence seizure (HCC) 02/14/2011  . ANXIETY 07/02/2010  .  ALLERGIC RHINITIS 01/30/2009  . COMMON MIGRAINE 08/29/2007  . NUMBNESS, HAND 08/29/2007  . ANEMIA, IRON DEFICIENCY, HX OF 08/29/2007    Past Surgical History:  Procedure Laterality Date  . ABDOMINAL HYSTERECTOMY     partial  . BREAST LUMPECTOMY WITH RADIOACTIVE SEED LOCALIZATION Left 11/13/2015   Procedure: BREAST LUMPECTOMY WITH LEFT RADIOACTIVE SEED LOCALIZATION;  Surgeon: Glenna FellowsBenjamin Hoxworth, MD;  Location: [facility];  Service: General;  Laterality: Left;  . CHOLECYSTECTOMY    . TUBAL LIGATION    . tubal reversal  2005    OB History    No data available       Home Medications    Prior to Admission medications   Medication Sig Start Date End Date Taking? Authorizing Provider  lamoTRIgine (LAMICTAL) 100 MG tablet Take 100 mg by mouth daily.   Yes [provider]  levETIRAcetam (KEPPRA) 250 MG tablet Take 250 mg by mouth 2 (two) times daily.   Yes [provider]  AMBULATORY NON FORMULARY MEDICATION Saliva testing for hormones due to menopause. 01/29/16   Breeback, Jade L, PA-C  azithromycin (ZITHROMAX Z-PAK) 250 MG tablet Take 2 tabs today; then begin one tab once daily for 4 more days. 03/30/17   Lattie HawBeese, Neysha Criado A, MD  Evening Primrose Oil 500 MG CAPS Take by mouth.    [provider]  fexofenadine (ALLEGRA) 180 MG tablet Take 180 mg by mouth daily.    [provider]  Magnesium 500  MG CAPS Take by mouth.    [provider]  Manganese 50 MG TABS Take by mouth.    [provider]  omeprazole (PRILOSEC) 40 MG capsule Take 1 capsule (40 mg total) by mouth daily. 04/01/16   Breeback, Jade L, PA-C  pyridOXINE (VITAMIN B-6) 50 MG tablet Take 50 mg by mouth daily.    [provider]  Taurine 500 MG CAPS Take by mouth.    [provider]  Vitamin E 400 units TABS Take by mouth.    [provider]    Family History Family History  Problem Relation Age of Onset  . Cancer Mother   . Cancer  Father   . Hypertension Sister     Social History Social History  Substance Use Topics  . Smoking status: Never Smoker  . Smokeless tobacco: Never Used  . Alcohol use No     Allergies   Cefdinir; Meperidine hcl; Morphine and related; Topamax [topiramate]; Zyrtec [cetirizine]; and Ciprofloxacin   Review of Systems Review of Systems No sore throat + cough No pleuritic pain + wheezing No nasal congestion ? post-nasal drainage No sinus pain/pressure No itchy/red eyes No earache No hemoptysis + SOB No fever, + chills/sweats + nausea + vomiting No abdominal pain + diarrhea No urinary symptoms No skin rash + fatigue No myalgias No headache + bilateral knee pain with extension. Used OTC meds without relief   Physical Exam Triage Vital Signs ED Triage Vitals  Enc Vitals Group     BP 03/30/17 1457 129/82     Pulse Rate 03/30/17 1457 84     Resp 03/30/17 1457 16     Temp 03/30/17 1457 99 F (37.2 C)     Temp Source 03/30/17 1457 Oral     SpO2 03/30/17 1457 96 %     Weight 03/30/17 1458 114 lb (51.7 kg)     Height --      Head Circumference --      Peak Flow --      Pain Score 03/30/17 1458 0     Pain Loc --      Pain Edu? --      Excl. in GC? --    No data found.   Updated Vital Signs BP 129/82 (BP Location: Left Arm)   Pulse 84   Temp 99 F (37.2 C) (Oral)   Resp 16   Wt 114 lb (51.7 kg)   LMP 12/18/2010   SpO2 96%   BMI 20.85 kg/m   Visual Acuity Right Eye Distance:   Left Eye Distance:   Bilateral Distance:    Right Eye Near:   Left Eye Near:    Bilateral Near:     Physical Exam Nursing notes and Vital Signs reviewed. Appearance:  Patient appears stated age, and in no acute distress Eyes:  Pupils are equal, round, and reactive to light and accomodation.  Extraocular movement is intact.  Conjunctivae are not inflamed  Ears:  Canals normal.  Tympanic membranes normal.  Nose:  Mildly congested turbinates.  No sinus tenderness.     Pharynx:  Normal Neck:  Supple.  Enlarged posterior/lateral nodes are palpated bilaterally, tender to palpation on the left.   Lungs:  Clear to auscultation.  Breath sounds are equal.  Moving air well. Heart:  Regular rate and rhythm without murmurs, rubs, or gallops.  Abdomen:  Nontender without masses or hepatosplenomegaly.  Bowel sounds are present.  No CVA or flank tenderness.  Extremities:  No  edema.  Bilateral knees:  No effusion, erythema, or warmth.  Knees stable, negative drawer test.  McMurray test negative.  Palpation over both patellae during knee flexion/extension reveals subtle retropatellar crepitance. Skin:  No rash present.    UC Treatments / Results  Labs (all labs ordered are listed, but only abnormal results are displayed) Labs Reviewed  POCT CBC W AUTO DIFF ([facility]):  WBC 4.0; LY 42.1; MO 9.5; GR 48.4;  Hgb 12.0; Platelets 217   POCT INFLUENZA A/B negative    EKG  EKG Interpretation None       Radiology Dg Chest 2 View  Result Date: 03/30/2017 CLINICAL DATA:  Persistent cough for 3 weeks EXAM: CHEST  2 VIEW COMPARISON:  12/14/2015 chest CT angiogram FINDINGS: Normal heart size. Normal mediastinal contour. No pneumothorax. No pleural effusion. Lungs appear clear, with no acute consolidative airspace disease and no pulmonary edema. Surgical clips are noted in the upper abdomen bilaterally. IMPRESSION: No active cardiopulmonary disease. Electronically Signed   By: Delbert Phenix M.D.   On: 03/30/2017 15:47    Procedures Procedures (including critical care time)  Medications Ordered in UC Medications - No data to display   Initial Impression / Assessment and Plan / UC Course  I have reviewed the triage vital signs and the nursing notes.  Pertinent labs & imaging results that were available during my care of the patient were reviewed by me and considered in my medical decision making (see chart for details).    ? Bronchitis. Begin Z-pak for  atypical coverage. Take plain guaifenesin (1200mg  extended release tabs such as Mucinex) twice daily, with plenty of water, for cough and congestion. Get adequate rest.   May take Delsym Cough Suppressant at bedtime for nighttime cough.  Stop all antihistamines for now, and other non-prescription cough/cold preparations.   Begin clear liquids (Pedialyte while having diarrhea) until improved, then advance to a [employer] (Bananas, Rice, Applesauce, Toast).  Then gradually resume a regular diet when tolerated.  Avoid milk products until well.    If symptoms become significantly worse during the night or over the weekend, proceed to the local emergency room.   Begin knee exercises as per instruction sheet.  Followup with Dr. Rodney Langton or Dr. Clementeen [facility] ([facility]) if not improving about two weeks.     Final Clinical Impressions(s) / UC Diagnoses   Final diagnoses:  Persistent cough for 3 weeks or longer  Nausea vomiting and diarrhea  Patellofemoral pain syndrome of both knees    New Prescriptions New Prescriptions   AZITHROMYCIN (ZITHROMAX Z-PAK) 250 MG TABLET    Take 2 tabs today; then begin one tab once daily for 4 more days.         Lattie Haw, MD 04/02/17 [phone]

## 2017-04-03 ENCOUNTER — Ambulatory Visit: Payer: BLUE CROSS/BLUE SHIELD | Admitting: Physician Assistant

## 2017-04-20 ENCOUNTER — Encounter: Payer: Self-pay | Admitting: Physician Assistant

## 2017-04-20 DIAGNOSIS — G379 Demyelinating disease of central nervous system, unspecified: Secondary | ICD-10-CM

## 2017-04-20 DIAGNOSIS — H5712 Ocular pain, left eye: Secondary | ICD-10-CM

## 2017-05-08 DIAGNOSIS — H5213 Myopia, bilateral: Secondary | ICD-10-CM | POA: Diagnosis not present

## 2017-05-08 DIAGNOSIS — H52223 Regular astigmatism, bilateral: Secondary | ICD-10-CM | POA: Diagnosis not present

## 2017-06-13 IMAGING — MG NEEDLE LOCALIZATION OF THE LEFT BREAST WITH MAMMO GUIDANCE
5 series · 5 of 5 positions shown · non-contrast
Comparison: Previous exam(s).

CLINICAL DATA: Distortion in the posterior aspect of the upper
inner quadrant of the left breast with discordant benign biopsy
results.

EXAM:
MAMMOGRAPHIC GUIDED RADIOACTIVE SEED LOCALIZATION OF THE LEFT BREAST

[L CC (1 of 2)]
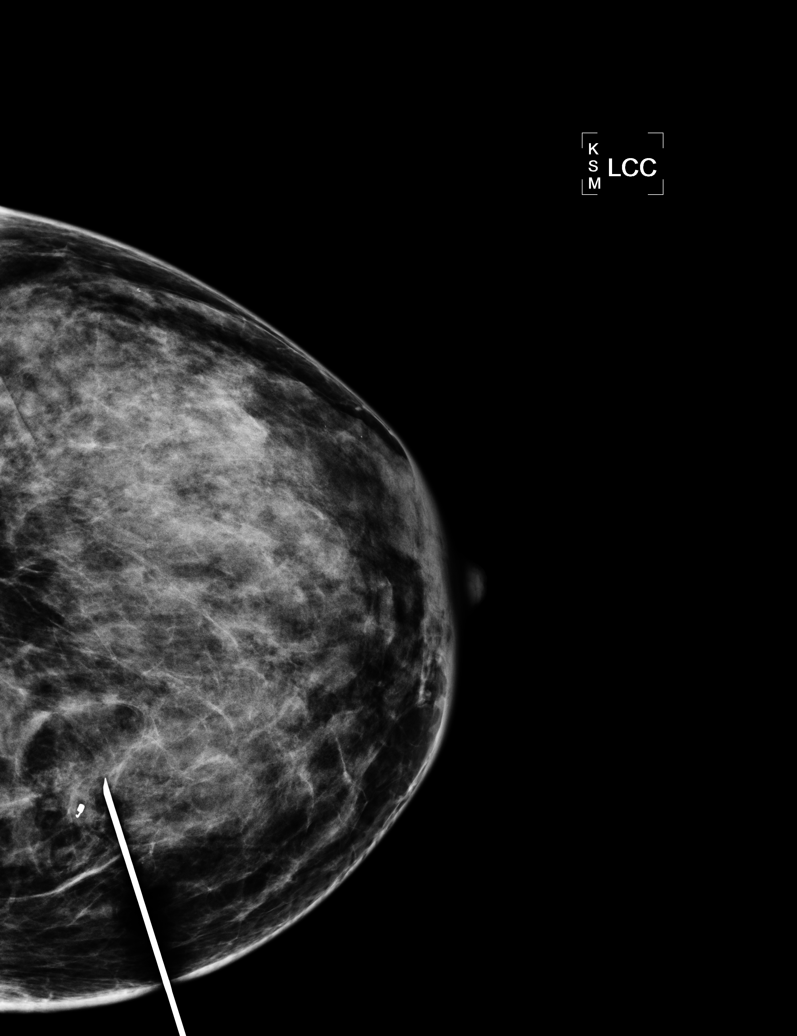

[L CC (2 of 2)]
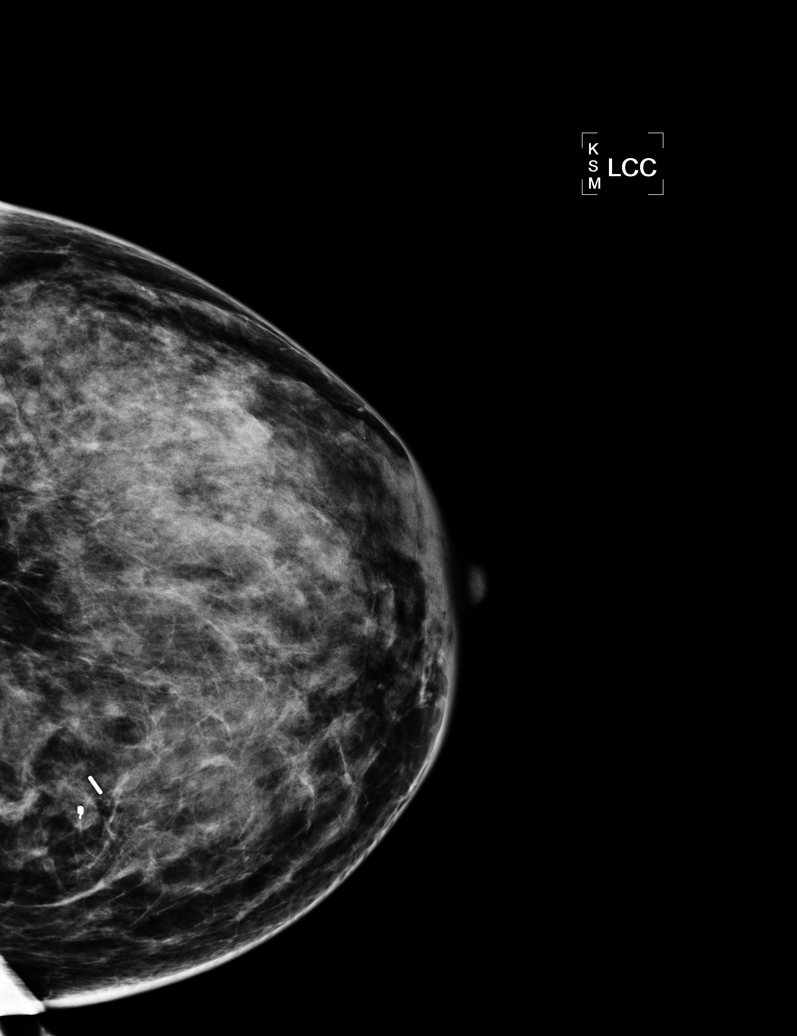

[L ML (1 of 3)]
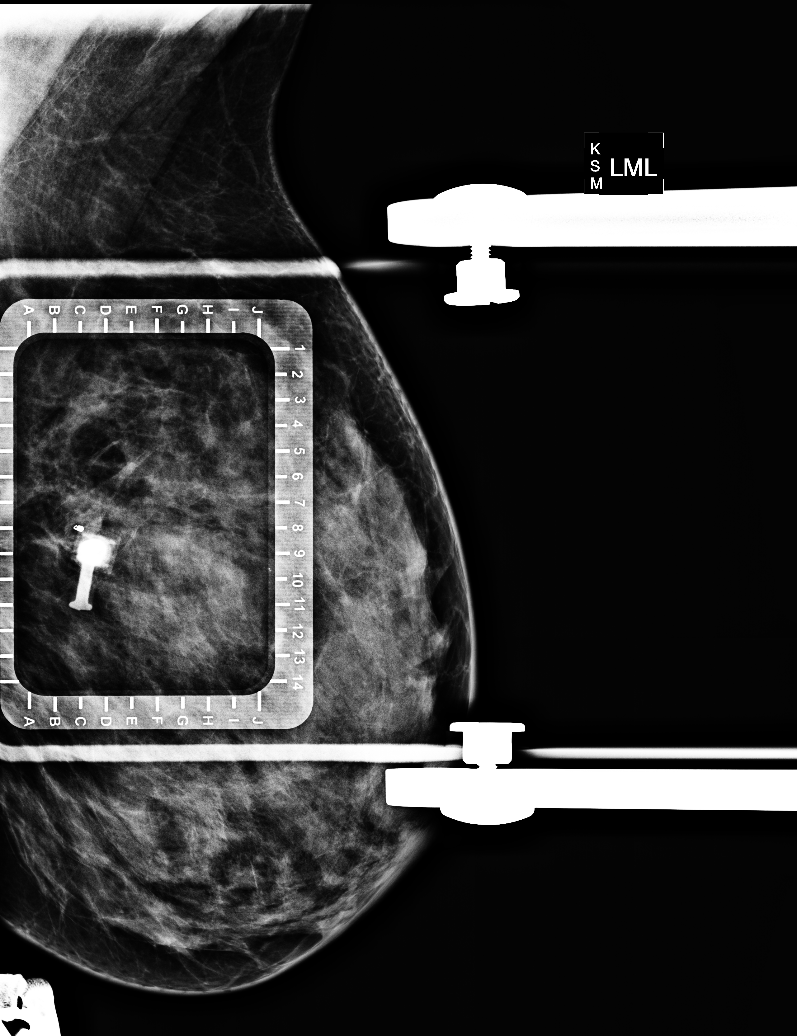

[L ML (2 of 3)]
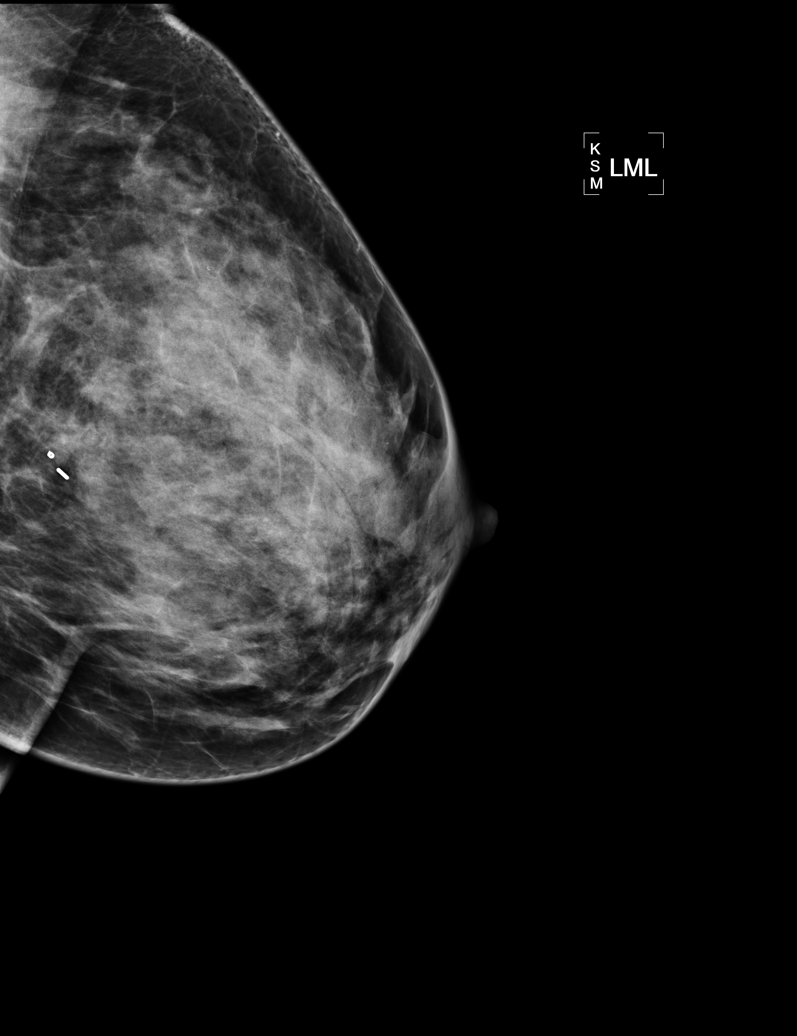

[L ML (3 of 3)]
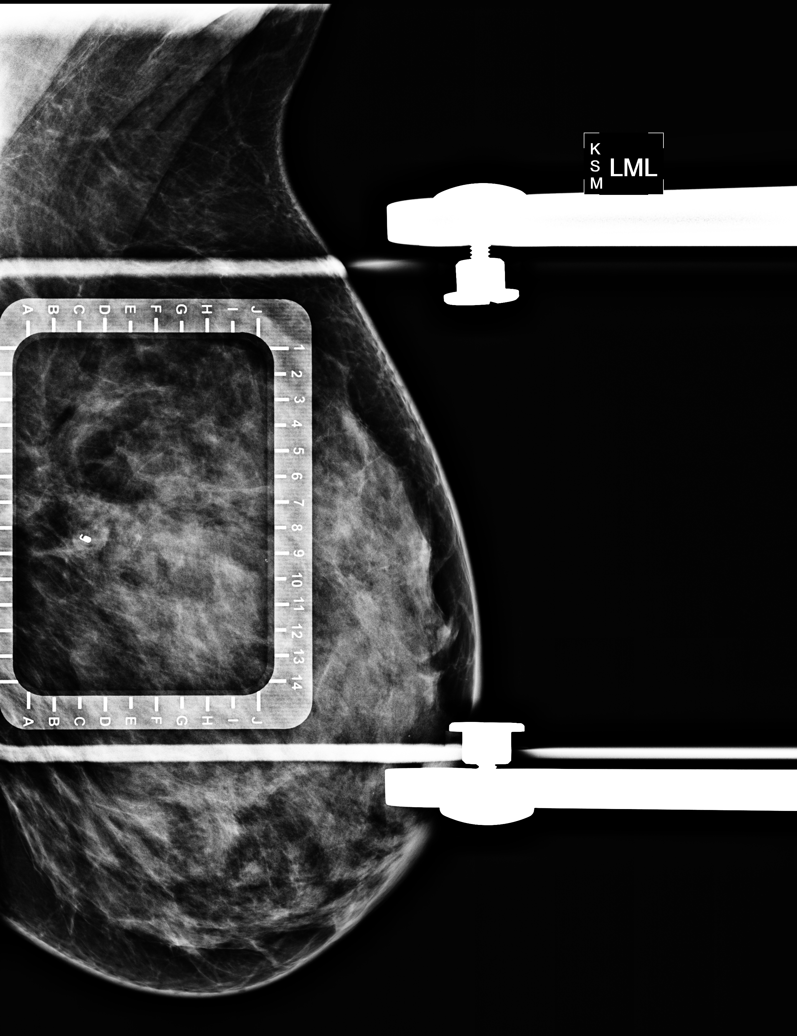

[5 of 5 positions shown; findings below may reference images not displayed]

FINDINGS: Patient presents for radioactive seed localization prior to surgical
excision of the area of distortion in the posterior aspect of the
upper inner quadrant of the left breast. I met with the patient and
we discussed the procedure of seed localization including benefits
and alternatives. We discussed the high likelihood of a successful
procedure. We discussed the risks of the procedure including
infection, bleeding, tissue injury and further surgery. We discussed
the low dose of radioactivity involved in the procedure. Informed,
written consent was given.

The usual time-out protocol was performed immediately prior to the
procedure.

Using mammographic guidance, sterile technique, 1% lidocaine and an
Q-QW2 radioactive seed, the recently placed coil shaped biopsy
marker clip in the posterior aspect of the upper inner quadrant of
the left breast was localized using a medial approach. The follow-up
mammogram images confirm the seed in the expected location and were
marked for Dr. Aujla.

Follow-up survey of the patient confirms presence of the radioactive
seed.

Order number of Q-QW2 seed:  [id number].

Total activity:  0.242 millicuries  Reference Date: 11/05/2015

The patient tolerated the procedure well and was released from the
[REDACTED]. She was given instructions regarding seed removal.
IMPRESSION: Radioactive seed localization left breast. No apparent
complications.

## 2017-06-15 IMAGING — MG BREAST SURGICAL SPECIMEN
1 series · 1 of 1 positions shown · non-contrast
Comparison: Previous exam(s).

CLINICAL DATA: Patient recently underwent biopsy of distortion in
the left breast. Pseudoangiomatous stromal hyperplasia and
fibrocystic change was reported histologically. This was found to be
discordant and excision recommended.

EXAM:
SPECIMEN RADIOGRAPH OF THE LEFT BREAST

[L]
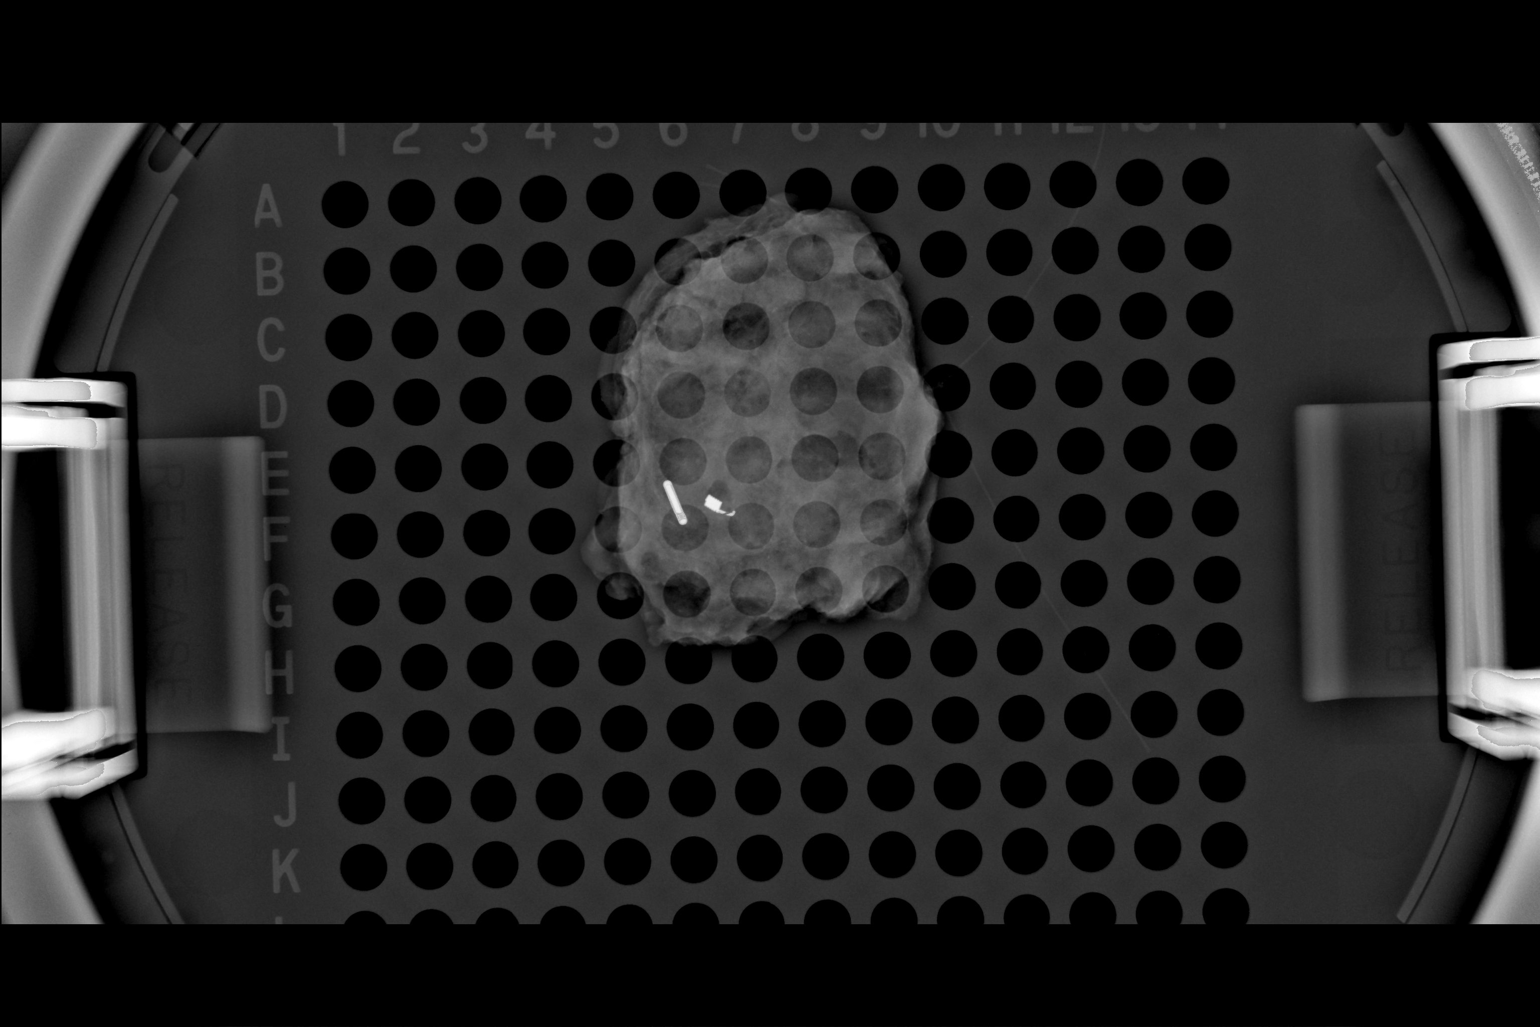

[1 of 1 positions shown; findings below may reference images not displayed]

FINDINGS: Status post excision of the left breast. The radioactive seed and
biopsy marker clip are present, completely intact, and were marked
for pathology.
IMPRESSION: Specimen radiograph of the left breast.

## 2017-06-20 ENCOUNTER — Ambulatory Visit (INDEPENDENT_AMBULATORY_CARE_PROVIDER_SITE_OTHER): Payer: BLUE CROSS/BLUE SHIELD | Admitting: Physician Assistant

## 2017-06-20 ENCOUNTER — Ambulatory Visit (INDEPENDENT_AMBULATORY_CARE_PROVIDER_SITE_OTHER): Payer: BLUE CROSS/BLUE SHIELD

## 2017-06-20 ENCOUNTER — Encounter: Payer: Self-pay | Admitting: Physician Assistant

## 2017-06-20 VITALS — BP 122/58 | HR 75 | Ht 62.0 in | Wt 117.0 lb

## 2017-06-20 DIAGNOSIS — M238X1 Other internal derangements of right knee: Secondary | ICD-10-CM | POA: Diagnosis not present

## 2017-06-20 DIAGNOSIS — M238X2 Other internal derangements of left knee: Secondary | ICD-10-CM | POA: Diagnosis not present

## 2017-06-20 DIAGNOSIS — Z13220 Encounter for screening for lipoid disorders: Secondary | ICD-10-CM

## 2017-06-20 DIAGNOSIS — Z131 Encounter for screening for diabetes mellitus: Secondary | ICD-10-CM | POA: Diagnosis not present

## 2017-06-20 DIAGNOSIS — R29898 Other symptoms and signs involving the musculoskeletal system: Secondary | ICD-10-CM | POA: Diagnosis not present

## 2017-06-20 DIAGNOSIS — M179 Osteoarthritis of knee, unspecified: Secondary | ICD-10-CM | POA: Diagnosis not present

## 2017-06-20 DIAGNOSIS — R569 Unspecified convulsions: Secondary | ICD-10-CM

## 2017-06-20 HISTORY — DX: Other symptoms and signs involving the musculoskeletal system: R29.898

## 2017-06-20 HISTORY — DX: Other internal derangements of right knee: M23.8X1

## 2017-06-20 HISTORY — DX: Other internal derangements of left knee: M23.8X2

## 2017-06-20 NOTE — Progress Notes (Addendum)
Subjective:    Patient ID: Destiny King, female    DOB: Oct 29, 1965, 52 y.o.   MRN: [id number]  HPI  Pt is a 52 year old female with a history seizures and multiple sclerosis-like symptoms is presenting to the clinic with bilateral crepitus and sensation of knee "buckling" . She reports that 4 months ago she began having bilateral crepitus with buckling sensations with no apparent trigger. She denies previous injury, recent injury, or previous surgeries on her knees. She has been walking 3 miles a day but reports the sensation is the most noticeable when using stairs which is accompanied by shaking during her movements. She denies pain in her knees, only complains of weakness, crepitus, and buckling.   She also wanted to share an update on her recent neurology visits. Destiny King has seen three neurologists for consults on her symptoms that include bursts of paresthesias, neuropathy, and weakness that began one year ago. Her most recent visits with her neurologist Dr. Roxanna King at [facility] show signs and symptoms that are consistent with multiple sclerosis but based on imaging and other criteria, she does not meet the full criteria to be diagnosed with multiple sclerosis. She reports that Dr. Roxanna King says that she would need evidence of optic neuritis or another spinal lesion to be consistent with the MS criteria. Destiny King was concerned as at her last doctor with her opthalmologist he asked her if she had ever had optic neuritis due to some evidence on exam that she may have signs of it.  She is going to report this to Dr. Roxanna King at her next visit.   .. Active Ambulatory Problems    Diagnosis Date Noted  . COMMON MIGRAINE 08/29/2007  . ALLERGIC RHINITIS 01/30/2009  . NUMBNESS, HAND 08/29/2007  . ANEMIA, IRON DEFICIENCY, HX OF 08/29/2007  . ANXIETY 07/02/2010  . Generalized convulsive epilepsy (HCC) 02/14/2011  . Absence seizure (HCC) 02/14/2011  . Rectal bleeding 01/12/2014  . Change in stool 01/12/2014   . H. pylori infection 02/04/2014  . Diverticulosis 02/04/2014  . Peptic ulcer disease 07/01/2014  . Dense breast tissue 08/26/2014  . Breast cancer screening 08/26/2014  . Allergic rhinitis due to pollen 03/06/2015  . Thyromegaly 03/06/2015  . Other fatigue 03/06/2015  . Difficulty swallowing 03/06/2015  . Seasonal allergies 03/06/2015  . Menopausal symptoms 10/12/2015  . Abnormal mammogram of left breast 10/23/2015  . Hematuria 10/27/2015  . Globus sensation 01/29/2016  . Chronic cystitis 01/29/2016  . Right arm weakness 04/04/2016  . Right leg weakness 04/04/2016  . Left eye pain 05/18/2016  . Lower extremity weakness 06/20/2017  . Seizures (HCC) 06/20/2017   Resolved Ambulatory Problems    Diagnosis Date Noted  . UTI (lower urinary tract infection) 12/29/2010   Past Medical History:  Diagnosis Date  . Anxiety   . Complication of anesthesia   . GERD (gastroesophageal reflux disease)   . Seizures (HCC)       Review of Systems  Constitutional: Positive for fatigue. Negative for chills and fever.  Musculoskeletal:       Negative for knee pain.   Neurological: Positive for weakness and numbness. Negative for dizziness and seizures.       Objective:   Physical Exam  Constitutional: She is oriented to person, place, and time. She appears well-developed and well-nourished. No distress.  HENT:  Head: Normocephalic and atraumatic.  Cardiovascular: Normal rate. Exam reveals no gallop and no friction rub.  No murmur heard. Pulmonary/Chest: Effort normal and breath sounds normal.  Musculoskeletal:       Right knee: She exhibits normal range of motion, no effusion, no LCL laxity, normal meniscus and no MCL laxity. No tenderness found. No medial joint line, no lateral joint line, no MCL and no LCL tenderness noted.       Left knee: She exhibits normal range of motion, no effusion, no LCL laxity, normal meniscus and no MCL laxity. No tenderness found. No medial joint line,  no lateral joint line, no MCL and no LCL tenderness noted.  Bilateral crepitus to palpation of patella during active extension and flexion 4+. Negative McMurray's bilaterally. Negative anterior drawer.   Strength of lower extremity was 3/5 bilaterally.   Neurological: She is alert and oriented to person, place, and time.  Psychiatric: She has a normal mood and affect. Her behavior is normal.    Vitals:   06/20/17 1302  BP: (!) 122/58  Pulse: 75         Assessment & Plan:  Diagnoses and all orders for this visit:  Crepitus of both knee joints -     DG Knee 4 Views W/Patella Left -     DG Knee 4 Views W/Patella Right  Screening for lipid disorders -     Lipid Panel w/reflex Direct LDL  Screening for diabetes mellitus -     COMPLETE METABOLIC PANEL WITH GFR  Weakness of both lower extremities  Seizures (HCC)   Bilateral x-rays of the knees will be obtained today to evaluate crepitus that began with quick onset about 4 months ago. There may be some arthritis but since not causing any pain I do not recommend any medication therapy. She has been encouraged to begin going to the gym to do specific exercises to strengthen her legs to hopefully stabilize her knees to prevent the buckling sensation. If she sees no improvement with her exercises we will consider a physical therapy prescription to supplement and increase strength.  She has also been recommended to speak with her neurologist to prescribe Metanx to hopefully aid in decrease her symptoms of neuropathy. I do think part of her "buckling sensation is neurological" she certainly does have some weakness. I think it is appropriate to begin treating for MS. she has follow up in next few weeks. I did print off old notes from Destiny King to give to new neurologist. She is encouraged to contact our office or me through myChart if she has any questions or concerns.   Pt has not had screening labs in over a year.   [name] Kitchen.Spent 30 minutes with  patient and greater than 50 percent of visit spent counseling patient regarding treatment plan.

## 2017-06-20 NOTE — Patient Instructions (Signed)
-   Metanx

## 2017-06-20 NOTE — Addendum Note (Signed)
Addended by: Jomarie Longs on: 06/20/2017 05:28 PM   Modules accepted: Level of Service

## 2017-06-21 LAB — COMPLETE METABOLIC PANEL WITH GFR
AG Ratio: 2.1 (calc) (ref 1.0–2.5)
ALT: 13 U/L (ref 6–29)
AST: 16 U/L (ref 10–35)
Albumin: 4.7 g/dL (ref 3.6–5.1)
Alkaline phosphatase (APISO): 55 U/L (ref 33–130)
BUN: 9 mg/dL (ref 7–25)
CO2: 29 mmol/L (ref 20–32)
Calcium: 9.6 mg/dL (ref 8.6–10.4)
Chloride: 103 mmol/L (ref 98–110)
Creat: 0.85 mg/dL (ref 0.50–1.05)
GFR, Est African American: 93 mL/min/{1.73_m2} (ref >=60)
GFR, Est Non African American: 80 mL/min/{1.73_m2} (ref >=60)
Globulin: 2.2 g/dL (calc) (ref 1.9–3.7)
Glucose, Bld: 88 mg/dL (ref 65–99)
Potassium: 3.9 mmol/L (ref 3.5–5.3)
Sodium: 140 mmol/L (ref 135–146)
Total Bilirubin: 0.7 mg/dL (ref 0.2–1.2)
Total Protein: 6.9 g/dL (ref 6.1–8.1)

## 2017-06-21 LAB — LIPID PANEL W/REFLEX DIRECT LDL
Cholesterol: 166 mg/dL (ref <200)
HDL: 93 mg/dL (ref >50)
LDL Cholesterol (Calc): 59 mg/dL (calc)
Non-HDL Cholesterol (Calc): 73 mg/dL (calc) (ref <130)
Total CHOL/HDL Ratio: 1.8 (calc) (ref <5.0)
Triglycerides: 55 mg/dL (ref <150)

## 2017-06-21 NOTE — Progress Notes (Signed)
Call pt: cholesterol looks great. Kidney, liver, glucose look great.

## 2017-06-21 NOTE — Progress Notes (Signed)
Call pt: left knee some arthritic changes seen behind patella could be causing crepitus. Right knee may be on its way to some degenerative changes. Strengthening muscles around knee is best treatment.

## 2017-06-21 NOTE — Progress Notes (Signed)
Call pt: right knee looks great.

## 2017-06-23 ENCOUNTER — Encounter: Payer: Self-pay | Admitting: Physician Assistant

## 2017-06-30 DIAGNOSIS — R569 Unspecified convulsions: Secondary | ICD-10-CM | POA: Diagnosis not present

## 2017-08-14 ENCOUNTER — Encounter: Payer: Self-pay | Admitting: Physician Assistant

## 2017-10-10 DIAGNOSIS — R5383 Other fatigue: Secondary | ICD-10-CM | POA: Diagnosis not present

## 2017-10-10 DIAGNOSIS — G35 Multiple sclerosis: Secondary | ICD-10-CM | POA: Diagnosis not present

## 2017-10-10 DIAGNOSIS — R930 Abnormal findings on diagnostic imaging of skull and head, not elsewhere classified: Secondary | ICD-10-CM | POA: Diagnosis not present

## 2017-10-10 DIAGNOSIS — G40109 Localization-related (focal) (partial) symptomatic epilepsy and epileptic syndromes with simple partial seizures, not intractable, without status epilepticus: Secondary | ICD-10-CM | POA: Diagnosis not present

## 2017-10-20 DIAGNOSIS — G35 Multiple sclerosis: Secondary | ICD-10-CM | POA: Diagnosis not present

## 2017-11-21 DIAGNOSIS — R471 Dysarthria and anarthria: Secondary | ICD-10-CM | POA: Diagnosis not present

## 2017-11-21 DIAGNOSIS — G40109 Localization-related (focal) (partial) symptomatic epilepsy and epileptic syndromes with simple partial seizures, not intractable, without status epilepticus: Secondary | ICD-10-CM | POA: Diagnosis not present

## 2017-11-21 DIAGNOSIS — R2681 Unsteadiness on feet: Secondary | ICD-10-CM | POA: Diagnosis not present

## 2017-11-21 DIAGNOSIS — G35 Multiple sclerosis: Secondary | ICD-10-CM | POA: Diagnosis not present

## 2017-12-04 DIAGNOSIS — R42 Dizziness and giddiness: Secondary | ICD-10-CM | POA: Diagnosis not present

## 2017-12-04 DIAGNOSIS — G40109 Localization-related (focal) (partial) symptomatic epilepsy and epileptic syndromes with simple partial seizures, not intractable, without status epilepticus: Secondary | ICD-10-CM | POA: Diagnosis not present

## 2017-12-04 DIAGNOSIS — R5383 Other fatigue: Secondary | ICD-10-CM | POA: Diagnosis not present

## 2017-12-04 DIAGNOSIS — G35 Multiple sclerosis: Secondary | ICD-10-CM | POA: Diagnosis not present

## 2017-12-04 DIAGNOSIS — R930 Abnormal findings on diagnostic imaging of skull and head, not elsewhere classified: Secondary | ICD-10-CM | POA: Diagnosis not present

## 2017-12-28 ENCOUNTER — Other Ambulatory Visit: Payer: Self-pay | Admitting: Physician Assistant

## 2017-12-28 DIAGNOSIS — Z1239 Encounter for other screening for malignant neoplasm of breast: Secondary | ICD-10-CM

## 2017-12-29 ENCOUNTER — Ambulatory Visit (INDEPENDENT_AMBULATORY_CARE_PROVIDER_SITE_OTHER): Payer: BLUE CROSS/BLUE SHIELD

## 2017-12-29 DIAGNOSIS — Z1239 Encounter for other screening for malignant neoplasm of breast: Secondary | ICD-10-CM

## 2017-12-29 DIAGNOSIS — Z1231 Encounter for screening mammogram for malignant neoplasm of breast: Secondary | ICD-10-CM | POA: Diagnosis not present

## 2017-12-29 DIAGNOSIS — R928 Other abnormal and inconclusive findings on diagnostic imaging of breast: Secondary | ICD-10-CM | POA: Diagnosis not present

## 2018-01-02 ENCOUNTER — Other Ambulatory Visit: Payer: Self-pay | Admitting: Physician Assistant

## 2018-01-02 ENCOUNTER — Encounter: Payer: Self-pay | Admitting: Physician Assistant

## 2018-01-02 DIAGNOSIS — R921 Mammographic calcification found on diagnostic imaging of breast: Secondary | ICD-10-CM | POA: Insufficient documentation

## 2018-01-02 HISTORY — DX: Mammographic calcification found on diagnostic imaging of breast: R92.1

## 2018-01-05 ENCOUNTER — Ambulatory Visit
Admission: RE | Admit: 2018-01-05 | Discharge: 2018-01-05 | Disposition: A | Discharge status: Home / Self Care | Payer: BLUE CROSS/BLUE SHIELD | Source: Ambulatory Visit | Attending: Physician Assistant | Admitting: Physician Assistant

## 2018-01-05 ENCOUNTER — Other Ambulatory Visit: Payer: Self-pay | Admitting: Physician Assistant

## 2018-01-05 DIAGNOSIS — N6310 Unspecified lump in the right breast, unspecified quadrant: Secondary | ICD-10-CM

## 2018-01-05 DIAGNOSIS — R921 Mammographic calcification found on diagnostic imaging of breast: Secondary | ICD-10-CM

## 2018-01-05 DIAGNOSIS — N6489 Other specified disorders of breast: Secondary | ICD-10-CM | POA: Diagnosis not present

## 2018-01-10 ENCOUNTER — Other Ambulatory Visit: Payer: Self-pay | Admitting: Physician Assistant

## 2018-07-09 ENCOUNTER — Ambulatory Visit
Admission: RE | Admit: 2018-07-09 | Discharge: 2018-07-09 | Disposition: A | Discharge status: Home / Self Care | Payer: BLUE CROSS/BLUE SHIELD | Source: Ambulatory Visit | Attending: Physician Assistant | Admitting: Physician Assistant

## 2018-07-09 ENCOUNTER — Other Ambulatory Visit: Payer: Self-pay | Admitting: Physician Assistant

## 2018-07-09 DIAGNOSIS — R921 Mammographic calcification found on diagnostic imaging of breast: Secondary | ICD-10-CM

## 2018-07-10 NOTE — Progress Notes (Signed)
Mammogram stable and right breast calcifications appear benign they are requesting a 6 month follow up.

## 2018-08-03 ENCOUNTER — Encounter: Payer: Self-pay | Admitting: Physician Assistant

## 2018-08-03 ENCOUNTER — Ambulatory Visit: Payer: BLUE CROSS/BLUE SHIELD | Admitting: Physician Assistant

## 2018-08-03 VITALS — BP 114/65 | HR 66 | Temp 98.6°F | Wt 121.0 lb

## 2018-08-03 DIAGNOSIS — N3001 Acute cystitis with hematuria: Secondary | ICD-10-CM

## 2018-08-03 DIAGNOSIS — R300 Dysuria: Secondary | ICD-10-CM | POA: Diagnosis not present

## 2018-08-03 LAB — POCT URINALYSIS DIPSTICK
Bilirubin, UA: NEGATIVE
Glucose, UA: NEGATIVE
Ketones, UA: NEGATIVE
Leukocytes, UA: NEGATIVE
Nitrite, UA: NEGATIVE
Protein, UA: NEGATIVE
Spec Grav, UA: >=1.03 — AB (ref 1.010–1.025)
Urobilinogen, UA: 0.2 E.U./dL
pH, UA: 5.5 (ref 5.0–8.0)

## 2018-08-03 MED ORDER — FLUCONAZOLE 150 MG PO TABS: 150.0000 mg | ORAL_TABLET | Freq: Once | ORAL | 0 refills | Status: AC

## 2018-08-03 MED ORDER — NITROFURANTOIN MONOHYD MACRO 100 MG PO CAPS: 100.0000 mg | ORAL_CAPSULE | Freq: Two times a day (BID) | ORAL | 0 refills | Status: DC

## 2018-08-03 NOTE — Patient Instructions (Signed)
Urinary Tract Infection, Adult A urinary tract infection (UTI) is an infection of any part of the urinary tract. The urinary tract includes:  The kidneys.  The ureters.  The bladder.  The urethra. These organs make, store, and get rid of pee (urine) in the body. What are the causes? This is caused by germs (bacteria) in your genital area. These germs grow and cause swelling (inflammation) of your urinary tract. What increases the risk? You are more likely to develop this condition if:  You have a small, thin tube (catheter) to drain pee.  You cannot control when you pee or poop (incontinence).  You are female, and: ? You use these methods to prevent pregnancy: ? A medicine that kills sperm (spermicide). ? A device that blocks sperm (diaphragm). ? You have low levels of a female hormone (estrogen). ? You are pregnant.  You have genes that add to your risk.  You are sexually active.  You take antibiotic medicines.  You have trouble peeing because of: ? A prostate that is bigger than normal, if you are female. ? A blockage in the part of your body that drains pee from the bladder (urethra). ? A kidney stone. ? A nerve condition that affects your bladder (neurogenic bladder). ? Not getting enough to drink. ? Not peeing often enough.  You have other conditions, such as: ? Diabetes. ? A weak disease-fighting system (immune system). ? Sickle cell disease. ? Gout. ? Injury of the spine. What are the signs or symptoms? Symptoms of this condition include:  Needing to pee right away (urgently).  Peeing often.  Peeing small amounts often.  Pain or burning when peeing.  Blood in the pee.  Pee that smells bad or not like normal.  Trouble peeing.  Pee that is cloudy.  Fluid coming from the vagina, if you are female.  Pain in the belly or lower back. Other symptoms include:  Throwing up (vomiting).  No urge to eat.  Feeling mixed up (confused).  Being tired  and grouchy (irritable).  A fever.  Watery poop (diarrhea). How is this treated? This condition may be treated with:  Antibiotic medicine.  Other medicines.  Drinking enough water. Follow these instructions at home:  Medicines  Take over-the-counter and prescription medicines only as told by your doctor.  If you were prescribed an antibiotic medicine, take it as told by your doctor. Do not stop taking it even if you start to feel better. General instructions  Make sure you: ? Pee until your bladder is empty. ? Do not hold pee for a long time. ? Empty your bladder after sex. ? Wipe from front to back after pooping if you are a female. Use each tissue one time when you wipe.  Drink enough fluid to keep your pee pale yellow.  Keep all follow-up visits as told by your doctor. This is important. Contact a doctor if:  You do not get better after 1-2 days.  Your symptoms go away and then come back. Get help right away if:  You have very bad back pain.  You have very bad pain in your lower belly.  You have a fever.  You are sick to your stomach (nauseous).  You are throwing up. Summary  A urinary tract infection (UTI) is an infection of any part of the urinary tract.  This condition is caused by germs in your genital area.  There are many risk factors for a UTI. These include having a small, thin   tube to drain pee and not being able to control when you pee or poop.  Treatment includes antibiotic medicines for germs.  Drink enough fluid to keep your pee pale yellow. This information is not intended to replace advice given to you by your health care provider. Make sure you discuss any questions you have with your health care provider. Document Released: 11/09/2007 Document Revised: 11/30/2017 Document Reviewed: 11/30/2017 Elsevier Interactive Patient Education  2019 [employer]  

## 2018-08-03 NOTE — Progress Notes (Signed)
Subjective:    Patient ID: Destiny King, female    DOB: [birth year], 53 y.o.   MRN: [id number]  HPI  Pt is a 53 yo female who presents to the clinic with dysuria for 2 days. Denies any fever, chills, flank pain, n/v/d. No discharge or itching. Not tried anything to make better. No hx of recurrent UTI's.   .. Active Ambulatory Problems    Diagnosis Date Noted  . COMMON MIGRAINE 08/29/2007  . ALLERGIC RHINITIS 01/30/2009  . NUMBNESS, HAND 08/29/2007  . ANEMIA, IRON DEFICIENCY, HX OF 08/29/2007  . ANXIETY 07/02/2010  . Generalized convulsive epilepsy (HCC) 02/14/2011  . Absence seizure (HCC) 02/14/2011  . Rectal bleeding 01/12/2014  . Change in stool 01/12/2014  . H. pylori infection 02/04/2014  . Diverticulosis 02/04/2014  . Peptic ulcer disease 07/01/2014  . Dense breast tissue 08/26/2014  . Breast cancer screening 08/26/2014  . Allergic rhinitis due to pollen 03/06/2015  . Thyromegaly 03/06/2015  . Other fatigue 03/06/2015  . Difficulty swallowing 03/06/2015  . Seasonal allergies 03/06/2015  . Menopausal symptoms 10/12/2015  . Abnormal mammogram of left breast 10/23/2015  . Hematuria 10/27/2015  . Globus sensation 01/29/2016  . Chronic cystitis 01/29/2016  . Right arm weakness 04/04/2016  . Right leg weakness 04/04/2016  . Left eye pain 05/18/2016  . Lower extremity weakness 06/20/2017  . Seizures (HCC) 06/20/2017  . Crepitus of both knee joints 06/20/2017  . Breast calcification, right 01/02/2018   Resolved Ambulatory Problems    Diagnosis Date Noted  . UTI (lower urinary tract infection) 12/29/2010   Past Medical History:  Diagnosis Date  . Anxiety   . Complication of anesthesia   . GERD (gastroesophageal reflux disease)      Review of Systems See HPI.     Objective:   Physical Exam Vitals signs reviewed.  Constitutional:      Appearance: Normal appearance.  HENT:     Head: Normocephalic and atraumatic.  Cardiovascular:     Rate and Rhythm: Normal  rate and regular rhythm.  Pulmonary:     Effort: Pulmonary effort is normal.     Breath sounds: Normal breath sounds.  Abdominal:     General: Bowel sounds are normal. There is no distension.     Palpations: Abdomen is soft.     Tenderness: There is no abdominal tenderness. There is no right CVA tenderness, left CVA tenderness or guarding.  Neurological:     General: No focal deficit present.     Mental Status: She is alert and oriented to person, place, and time.           Assessment & Plan:  [name] KitchenMarland KitchenDiamonds was seen today for urinary tract infection.  Diagnoses and all orders for this visit:  Acute cystitis with hematuria -     nitrofurantoin, macrocrystal-monohydrate, (MACROBID) 100 MG capsule; Take 1 capsule (100 mg total) by mouth 2 (two) times daily.  Dysuria -     POCT Urinalysis Dipstick -     Urine Culture  Other orders -     fluconazole (DIFLUCAN) 150 MG tablet; Take 1 tablet (150 mg total) by mouth once for 1 dose.  .. Results for orders placed or performed in visit on 08/03/18  POCT Urinalysis Dipstick  Result Value Ref Range   Color, UA yellow    Clarity, UA clear    Glucose, UA Negative Negative   Bilirubin, UA neg    Ketones, UA neg    Spec Grav, UA >=1.030 (A) 1.010 -  1.025   Blood, UA moderate    pH, UA 5.5 5.0 - 8.0   Protein, UA Negative Negative   Urobilinogen, UA 0.2 0.2 or 1.0 E.U./dL   Nitrite, UA NEG    Leukocytes, UA Negative Negative   Appearance     Odor      Positive for blood. Due to symptoms treated empirically with macrobid. Sent for culture. Discussed symptomatic care. HO given. May stop abx if culture is negative. Follow up as needed.   Pt has hx of yeast infection after abx. Sent diflucan.

## 2018-08-04 LAB — URINE CULTURE
MICRO NUMBER:: 257168
Result:: NO GROWTH
SPECIMEN QUALITY:: ADEQUATE

## 2018-08-05 NOTE — Progress Notes (Signed)
No UTI. Ok to stop antibiotic.

## 2018-08-06 ENCOUNTER — Encounter: Payer: Self-pay | Admitting: Physician Assistant

## 2018-08-13 DIAGNOSIS — H53453 Other localized visual field defect, bilateral: Secondary | ICD-10-CM | POA: Diagnosis not present

## 2018-08-17 ENCOUNTER — Encounter: Payer: Self-pay | Admitting: Physician Assistant

## 2018-08-17 ENCOUNTER — Ambulatory Visit: Payer: BLUE CROSS/BLUE SHIELD | Admitting: Physician Assistant

## 2018-08-17 ENCOUNTER — Other Ambulatory Visit: Payer: Self-pay

## 2018-08-17 VITALS — BP 128/59 | HR 68 | Temp 99.2°F | Ht 62.0 in | Wt 116.0 lb

## 2018-08-17 DIAGNOSIS — R059 Cough, unspecified: Secondary | ICD-10-CM

## 2018-08-17 DIAGNOSIS — R509 Fever, unspecified: Secondary | ICD-10-CM

## 2018-08-17 DIAGNOSIS — R05 Cough: Secondary | ICD-10-CM | POA: Diagnosis not present

## 2018-08-17 DIAGNOSIS — R319 Hematuria, unspecified: Secondary | ICD-10-CM

## 2018-08-17 MED ORDER — PREDNISONE 50 MG PO TABS: ORAL_TABLET | ORAL | 0 refills | Status: DC

## 2018-08-17 MED ORDER — BENZONATATE 200 MG PO CAPS: 200.0000 mg | ORAL_CAPSULE | Freq: Two times a day (BID) | ORAL | 0 refills | Status: DC | PRN

## 2018-08-17 NOTE — Progress Notes (Signed)
Subjective:    Patient ID: Destiny King, female    DOB: July 06, 1965, 53 y.o.   MRN: [id number]  HPI  Pt is a 53 yo female who has suspected MS and hx of hematuria who presents to follow up with increased amount of hematuria in urine. Pt historically has had trace hematuria in urine dipstick and now she has had twice moderate hematuria. She saw paul coughlin about 3 years ago and work up was negative. This does make her concerned today. She denies any urinary symptoms, flank or abdominal pain.   Pt does have a dry cough today. She notes present for about 2-4 days. She is feeling tired and under the weather. No SOB or fever. No sinus pressure, ear pain or ST. Not taking anything to make her better.   .. Active Ambulatory Problems    Diagnosis Date Noted  . COMMON MIGRAINE 08/29/2007  . ALLERGIC RHINITIS 01/30/2009  . NUMBNESS, HAND 08/29/2007  . ANEMIA, IRON DEFICIENCY, HX OF 08/29/2007  . ANXIETY 07/02/2010  . Generalized convulsive epilepsy (HCC) 02/14/2011  . Absence seizure (HCC) 02/14/2011  . Rectal bleeding 01/12/2014  . Change in stool 01/12/2014  . H. pylori infection 02/04/2014  . Diverticulosis 02/04/2014  . Peptic ulcer disease 07/01/2014  . Dense breast tissue 08/26/2014  . Breast cancer screening 08/26/2014  . Allergic rhinitis due to pollen 03/06/2015  . Thyromegaly 03/06/2015  . Other fatigue 03/06/2015  . Difficulty swallowing 03/06/2015  . Seasonal allergies 03/06/2015  . Menopausal symptoms 10/12/2015  . Abnormal mammogram of left breast 10/23/2015  . Hematuria 10/27/2015  . Globus sensation 01/29/2016  . Chronic cystitis 01/29/2016  . Right arm weakness 04/04/2016  . Right leg weakness 04/04/2016  . Left eye pain 05/18/2016  . Lower extremity weakness 06/20/2017  . Seizures (HCC) 06/20/2017  . Crepitus of both knee joints 06/20/2017  . Breast calcification, right 01/02/2018   Resolved Ambulatory Problems    Diagnosis Date Noted  . UTI (lower urinary  tract infection) 12/29/2010   Past Medical History:  Diagnosis Date  . Anxiety   . Complication of anesthesia   . GERD (gastroesophageal reflux disease)        Review of Systems See HPI.    Objective:   Physical Exam Vitals signs reviewed.  Constitutional:      Appearance: Normal appearance.  HENT:     Head: Normocephalic.     Right Ear: Tympanic membrane normal.     Left Ear: Tympanic membrane normal.     Nose: Nose normal.     Mouth/Throat:     Mouth: Mucous membranes are moist.     Pharynx: Oropharynx is clear. No posterior oropharyngeal erythema.  Eyes:     Conjunctiva/sclera: Conjunctivae normal.  Cardiovascular:     Rate and Rhythm: Normal rate and regular rhythm.     Pulses: Normal pulses.  Pulmonary:     Effort: Pulmonary effort is normal.     Breath sounds: Normal breath sounds.  Abdominal:     General: There is no distension.     Palpations: Abdomen is soft.     Tenderness: There is no abdominal tenderness. There is no right CVA tenderness, left CVA tenderness, guarding or rebound.  Neurological:     General: No focal deficit present.     Mental Status: She is alert and oriented to person, place, and time.  Psychiatric:        Mood and Affect: Mood normal.  Assessment & Plan:  [name] KitchenMarland KitchenDiagnoses and all orders for this visit:  Hematuria, unspecified type -     POCT Urinalysis Dipstick -     Urine Culture -     Urinalysis, microscopic only -     TIQ-MISC -     Ambulatory referral to Urology  Low grade fever -     POCT Influenza A/B  Cough -     predniSONE (DELTASONE) 50 MG tablet; One tab PO daily for 5 days. -     benzonatate (TESSALON) 200 MG capsule; Take 1 capsule (200 mg total) by mouth 2 (two) times daily as needed for cough.  .. Results for orders placed or performed in visit on 08/17/18  Urine Culture  Result Value Ref Range   MICRO NUMBER: [id number]    SPECIMEN QUALITY: Adequate    Sample Source NOT GIVEN    STATUS: FINAL     Result:      Multiple organisms present, each less than 10,000 CFU/mL. These organisms, commonly found on external and internal genitalia, are considered to be colonizers. No further testing performed.  TIQ-MISC  Result Value Ref Range   QUESTION/PROBLEM:     QUESTION: UC/KUC or UM/KUM not appropriate for test ordered.   POCT Urinalysis Dipstick  Result Value Ref Range   Color, UA yellow    Clarity, UA clear    Glucose, UA Negative Negative   Bilirubin, UA small    Ketones, UA trace    Spec Grav, UA 1.025 1.010 - 1.025   Blood, UA moderate    pH, UA 6.0 5.0 - 8.0   Protein, UA Negative Negative   Urobilinogen, UA 0.2 0.2 or 1.0 E.U./dL   Nitrite, UA negative    Leukocytes, UA Negative Negative   Appearance     Odor    POCT Influenza A/B  Result Value Ref Range   Influenza A, POC Negative Negative   Influenza B, POC Negative Negative    Low grade fever today. Tested for flu which was negative. Suspect viral cough and the start of bronchitis. Prednisone for 5 days and tessalon pearls. Consider starting zyrtec/claritin for allergy symptoms prevention as well.   Rechecked dipstick and remained moderate blood. Will culture and add microscopic. Pt needs follow up with urologist due to the amount of blood change. Referral placed.

## 2018-08-17 NOTE — Patient Instructions (Signed)
Start zyrtec/allegra.  Prednisone Tessalon cough drops.   Follow up with Dr. Tommie Ard.

## 2018-08-18 LAB — TIQ-MISC

## 2018-08-19 ENCOUNTER — Encounter: Payer: Self-pay | Admitting: Physician Assistant

## 2018-08-19 NOTE — Progress Notes (Signed)
Call pt: no infection detected.

## 2018-08-20 LAB — POCT URINALYSIS DIPSTICK
Glucose, UA: NEGATIVE
Leukocytes, UA: NEGATIVE
Nitrite, UA: NEGATIVE
Protein, UA: NEGATIVE
Spec Grav, UA: 1.025 (ref 1.010–1.025)
Urobilinogen, UA: 0.2 E.U./dL
pH, UA: 6 (ref 5.0–8.0)

## 2018-08-20 LAB — POCT INFLUENZA A/B
Influenza A, POC: NEGATIVE
Influenza B, POC: NEGATIVE

## 2018-08-21 LAB — URINE CULTURE
MICRO NUMBER:: 317447
SPECIMEN QUALITY:: ADEQUATE

## 2018-08-22 ENCOUNTER — Telehealth: Payer: Self-pay | Admitting: Physician Assistant

## 2018-08-22 NOTE — Telephone Encounter (Signed)
Pt advised. She will wait at this time.

## 2018-08-22 NOTE — Telephone Encounter (Signed)
I really think you are ok to wait since you have a history of hematuria and you have no symptoms. If this makes you too nervous we can certainly make another referral but I think it would be nice to see the same person who previously evaluated you.

## 2018-08-22 NOTE — Telephone Encounter (Signed)
Routing to PCP for review on wait time.

## 2018-08-22 NOTE — Telephone Encounter (Signed)
Pt called because we gave her a referral to Urology and they can not get her into May and she wants to know if waiting this long will be okay or do you need to get her in sooner. Please call pt and advise with your thoughts. I will cc Cindy on this as well.

## 2018-08-27 DIAGNOSIS — R1031 Right lower quadrant pain: Secondary | ICD-10-CM | POA: Diagnosis not present

## 2018-08-27 DIAGNOSIS — Z87448 Personal history of other diseases of urinary system: Secondary | ICD-10-CM | POA: Diagnosis not present

## 2018-08-27 DIAGNOSIS — N951 Menopausal and female climacteric states: Secondary | ICD-10-CM | POA: Diagnosis not present

## 2018-09-05 ENCOUNTER — Encounter: Payer: Self-pay | Admitting: Physician Assistant

## 2018-09-06 MED ORDER — AZITHROMYCIN 250 MG PO TABS: ORAL_TABLET | ORAL | 0 refills | Status: DC

## 2018-09-06 NOTE — Telephone Encounter (Signed)
Pt was seen 3/13 for cough. Treated with prednisone and tessalon pearls. She continues to worsen with more productive cough. Will send zpak.

## 2018-09-27 DIAGNOSIS — Z90710 Acquired absence of both cervix and uterus: Secondary | ICD-10-CM | POA: Diagnosis not present

## 2018-09-27 DIAGNOSIS — N838 Other noninflammatory disorders of ovary, fallopian tube and broad ligament: Secondary | ICD-10-CM | POA: Diagnosis not present

## 2018-09-27 DIAGNOSIS — R1030 Lower abdominal pain, unspecified: Secondary | ICD-10-CM | POA: Diagnosis not present

## 2018-11-01 DIAGNOSIS — R319 Hematuria, unspecified: Secondary | ICD-10-CM | POA: Diagnosis not present

## 2018-11-01 DIAGNOSIS — R310 Gross hematuria: Secondary | ICD-10-CM | POA: Diagnosis not present

## 2018-12-12 DIAGNOSIS — R1030 Lower abdominal pain, unspecified: Secondary | ICD-10-CM | POA: Diagnosis not present

## 2018-12-12 DIAGNOSIS — R3129 Other microscopic hematuria: Secondary | ICD-10-CM | POA: Diagnosis not present

## 2018-12-12 DIAGNOSIS — M549 Dorsalgia, unspecified: Secondary | ICD-10-CM | POA: Diagnosis not present

## 2018-12-12 DIAGNOSIS — R109 Unspecified abdominal pain: Secondary | ICD-10-CM | POA: Diagnosis not present

## 2018-12-12 DIAGNOSIS — R319 Hematuria, unspecified: Secondary | ICD-10-CM | POA: Diagnosis not present

## 2019-01-04 DIAGNOSIS — G40109 Localization-related (focal) (partial) symptomatic epilepsy and epileptic syndromes with simple partial seizures, not intractable, without status epilepticus: Secondary | ICD-10-CM | POA: Diagnosis not present

## 2019-01-04 DIAGNOSIS — G35 Multiple sclerosis: Secondary | ICD-10-CM | POA: Diagnosis not present

## 2019-01-04 DIAGNOSIS — R5383 Other fatigue: Secondary | ICD-10-CM | POA: Diagnosis not present

## 2019-01-04 DIAGNOSIS — R930 Abnormal findings on diagnostic imaging of skull and head, not elsewhere classified: Secondary | ICD-10-CM | POA: Diagnosis not present

## 2019-01-08 ENCOUNTER — Ambulatory Visit
Admission: RE | Admit: 2019-01-08 | Discharge: 2019-01-08 | Disposition: A | Discharge status: Home / Self Care | Payer: BC Managed Care – PPO | Source: Ambulatory Visit | Attending: Physician Assistant | Admitting: Physician Assistant

## 2019-01-08 ENCOUNTER — Other Ambulatory Visit: Payer: Self-pay

## 2019-01-08 DIAGNOSIS — R921 Mammographic calcification found on diagnostic imaging of breast: Secondary | ICD-10-CM

## 2019-01-08 DIAGNOSIS — R922 Inconclusive mammogram: Secondary | ICD-10-CM | POA: Diagnosis not present

## 2019-01-30 DIAGNOSIS — G35 Multiple sclerosis: Secondary | ICD-10-CM | POA: Diagnosis not present

## 2019-03-29 DIAGNOSIS — R197 Diarrhea, unspecified: Secondary | ICD-10-CM | POA: Diagnosis not present

## 2019-03-29 DIAGNOSIS — R110 Nausea: Secondary | ICD-10-CM | POA: Diagnosis not present

## 2019-03-29 DIAGNOSIS — J209 Acute bronchitis, unspecified: Secondary | ICD-10-CM | POA: Diagnosis not present

## 2019-03-29 DIAGNOSIS — Z20828 Contact with and (suspected) exposure to other viral communicable diseases: Secondary | ICD-10-CM | POA: Diagnosis not present

## 2019-08-13 DIAGNOSIS — R05 Cough: Secondary | ICD-10-CM | POA: Diagnosis not present

## 2019-08-13 DIAGNOSIS — Z20822 Contact with and (suspected) exposure to covid-19: Secondary | ICD-10-CM | POA: Diagnosis not present

## 2019-08-29 DIAGNOSIS — R350 Frequency of micturition: Secondary | ICD-10-CM | POA: Diagnosis not present

## 2019-08-29 DIAGNOSIS — R8290 Unspecified abnormal findings in urine: Secondary | ICD-10-CM | POA: Diagnosis not present

## 2019-08-29 DIAGNOSIS — Z01419 Encounter for gynecological examination (general) (routine) without abnormal findings: Secondary | ICD-10-CM | POA: Diagnosis not present

## 2019-08-29 DIAGNOSIS — R232 Flushing: Secondary | ICD-10-CM | POA: Diagnosis not present

## 2019-09-21 DIAGNOSIS — Z1211 Encounter for screening for malignant neoplasm of colon: Secondary | ICD-10-CM | POA: Diagnosis not present

## 2019-09-21 DIAGNOSIS — K648 Other hemorrhoids: Secondary | ICD-10-CM | POA: Diagnosis not present

## 2019-11-07 DIAGNOSIS — A048 Other specified bacterial intestinal infections: Secondary | ICD-10-CM | POA: Diagnosis not present

## 2019-11-26 DIAGNOSIS — G35 Multiple sclerosis: Secondary | ICD-10-CM | POA: Diagnosis not present

## 2019-11-26 DIAGNOSIS — H469 Unspecified optic neuritis: Secondary | ICD-10-CM | POA: Diagnosis not present

## 2020-01-14 DIAGNOSIS — R0989 Other specified symptoms and signs involving the circulatory and respiratory systems: Secondary | ICD-10-CM | POA: Diagnosis not present

## 2020-01-14 DIAGNOSIS — M791 Myalgia, unspecified site: Secondary | ICD-10-CM | POA: Diagnosis not present

## 2020-01-14 DIAGNOSIS — R0981 Nasal congestion: Secondary | ICD-10-CM | POA: Diagnosis not present

## 2020-01-14 DIAGNOSIS — R05 Cough: Secondary | ICD-10-CM | POA: Diagnosis not present

## 2020-01-14 DIAGNOSIS — R509 Fever, unspecified: Secondary | ICD-10-CM | POA: Diagnosis not present

## 2020-01-14 DIAGNOSIS — Z20822 Contact with and (suspected) exposure to covid-19: Secondary | ICD-10-CM | POA: Diagnosis not present

## 2020-01-14 DIAGNOSIS — J029 Acute pharyngitis, unspecified: Secondary | ICD-10-CM | POA: Diagnosis not present

## 2020-01-17 ENCOUNTER — Other Ambulatory Visit: Payer: Self-pay | Admitting: Physician Assistant

## 2020-01-17 DIAGNOSIS — R921 Mammographic calcification found on diagnostic imaging of breast: Secondary | ICD-10-CM

## 2020-02-06 ENCOUNTER — Ambulatory Visit
Admission: RE | Admit: 2020-02-06 | Discharge: 2020-02-06 | Disposition: A | Discharge status: Home / Self Care | Payer: BC Managed Care – PPO | Source: Ambulatory Visit | Attending: Physician Assistant | Admitting: Physician Assistant

## 2020-02-06 ENCOUNTER — Other Ambulatory Visit: Payer: Self-pay

## 2020-02-06 DIAGNOSIS — R921 Mammographic calcification found on diagnostic imaging of breast: Secondary | ICD-10-CM

## 2020-02-11 NOTE — Progress Notes (Signed)
Destiny King,   Stable calcifications. Screening mammogram in 1 year.

## 2020-02-27 DIAGNOSIS — G40109 Localization-related (focal) (partial) symptomatic epilepsy and epileptic syndromes with simple partial seizures, not intractable, without status epilepticus: Secondary | ICD-10-CM | POA: Diagnosis not present

## 2020-02-27 DIAGNOSIS — E538 Deficiency of other specified B group vitamins: Secondary | ICD-10-CM | POA: Diagnosis not present

## 2020-02-27 DIAGNOSIS — R5383 Other fatigue: Secondary | ICD-10-CM | POA: Diagnosis not present

## 2020-02-27 DIAGNOSIS — E559 Vitamin D deficiency, unspecified: Secondary | ICD-10-CM | POA: Diagnosis not present

## 2020-02-27 DIAGNOSIS — H469 Unspecified optic neuritis: Secondary | ICD-10-CM | POA: Diagnosis not present

## 2020-02-27 DIAGNOSIS — D529 Folate deficiency anemia, unspecified: Secondary | ICD-10-CM | POA: Diagnosis not present

## 2020-02-27 DIAGNOSIS — G35 Multiple sclerosis: Secondary | ICD-10-CM | POA: Diagnosis not present

## 2020-03-23 ENCOUNTER — Encounter: Payer: Self-pay | Admitting: Physician Assistant

## 2020-03-24 ENCOUNTER — Encounter: Payer: Self-pay | Admitting: Infectious Diseases

## 2020-03-25 ENCOUNTER — Telehealth (HOSPITAL_COMMUNITY): Payer: Self-pay

## 2020-03-25 ENCOUNTER — Other Ambulatory Visit: Payer: Self-pay | Admitting: Physician Assistant

## 2020-03-25 ENCOUNTER — Encounter: Payer: Self-pay | Admitting: Physician Assistant

## 2020-03-25 DIAGNOSIS — G40309 Generalized idiopathic epilepsy and epileptic syndromes, not intractable, without status epilepticus: Secondary | ICD-10-CM

## 2020-03-25 DIAGNOSIS — U071 COVID-19: Secondary | ICD-10-CM

## 2020-03-25 DIAGNOSIS — G35 Multiple sclerosis: Secondary | ICD-10-CM

## 2020-03-25 NOTE — Telephone Encounter (Signed)
Prednisone, or infusion?

## 2020-03-25 NOTE — Progress Notes (Signed)
I connected by phone with Destiny King on 03/25/2020 at 11:59 AM to discuss the potential use of a new treatment for mild to moderate COVID-19 viral infection in non-hospitalized patients.  This patient is a 54 y.o. female that meets the FDA criteria for Emergency Use Authorization of COVID monoclonal antibody casirivimab/imdevimab or bamlanivimab/eteseviamb.  Has a (+) direct SARS-CoV-2 viral test result  Has mild or moderate COVID-19   Is NOT hospitalized due to COVID-19  Is within 10 days of symptom onset  Has at least one of the high risk factor(s) for progression to severe COVID-19 and/or hospitalization as defined in EUA.  Specific high risk criteria : Neurodevelopmental disorder and Other high risk medical condition per CDC:  Geographic location   I have spoken and communicated the following to the patient or parent/caregiver regarding COVID monoclonal antibody treatment:  1. FDA has authorized the emergency use for the treatment of mild to moderate COVID-19 in adults and pediatric patients with positive results of direct SARS-CoV-2 viral testing who are 53 years of age and older weighing at least 40 kg, and who are at high risk for progressing to severe COVID-19 and/or hospitalization.  2. The significant known and potential risks and benefits of COVID monoclonal antibody, and the extent to which such potential risks and benefits are unknown.  3. Information on available alternative treatments and the risks and benefits of those alternatives, including clinical trials.  4. Patients treated with COVID monoclonal antibody should continue to self-isolate and use infection control measures (e.g., wear mask, isolate, social distance, avoid sharing personal items, clean and disinfect "high touch" surfaces, and frequent handwashing) according to CDC guidelines.   5. The patient or parent/caregiver has the option to accept or refuse COVID monoclonal antibody treatment.  After reviewing  this information with the patient, the patient has agreed to receive one of the available covid 19 monoclonal antibodies and will be provided an appropriate fact sheet prior to infusion. [place], [place] 03/25/2020 11:59 AM

## 2020-03-25 NOTE — Telephone Encounter (Signed)
Called to Discuss with patient about Covid symptoms and the use of the monoclonal antibody infusion for those with mild to moderate Covid symptoms and at a high risk of hospitalization.     Pt appears to qualify for this infusion due to co-morbid conditions and/or a member of an at-risk group in accordance with the FDA Emergency Use Authorization.    Patient called and pre-screened for MAb treatment. Patient states symptoms started on 03/18/20 and tested (+) COVID on 03/20/20. Patient has a health history of seizures and MS. Patient c/o cough, fever, diarrhea, vertigo, body aches and nausea. Patient is interested in MAb treatment. Patient has been informed that [facility] will call them with further information.

## 2020-03-26 ENCOUNTER — Ambulatory Visit (HOSPITAL_COMMUNITY): Payer: BC Managed Care – PPO

## 2020-03-27 MED ORDER — MECLIZINE HCL 25 MG PO TABS: 25.0000 mg | ORAL_TABLET | Freq: Three times a day (TID) | ORAL | 0 refills | Status: DC | PRN

## 2020-03-27 NOTE — Telephone Encounter (Signed)
I have not either. Maybe call [facility] and see how they do that? Rx order?

## 2020-03-30 DIAGNOSIS — U071 COVID-19: Secondary | ICD-10-CM

## 2020-03-30 DIAGNOSIS — Z23 Encounter for immunization: Secondary | ICD-10-CM | POA: Diagnosis not present

## 2020-03-30 HISTORY — DX: COVID-19: U07.1

## 2020-04-14 ENCOUNTER — Encounter: Payer: Self-pay | Admitting: Physician Assistant

## 2020-04-14 DIAGNOSIS — M5417 Radiculopathy, lumbosacral region: Secondary | ICD-10-CM | POA: Diagnosis not present

## 2020-04-14 DIAGNOSIS — R202 Paresthesia of skin: Secondary | ICD-10-CM | POA: Diagnosis not present

## 2020-04-14 DIAGNOSIS — G603 Idiopathic progressive neuropathy: Secondary | ICD-10-CM | POA: Diagnosis not present

## 2020-04-14 DIAGNOSIS — G40109 Localization-related (focal) (partial) symptomatic epilepsy and epileptic syndromes with simple partial seizures, not intractable, without status epilepticus: Secondary | ICD-10-CM | POA: Diagnosis not present

## 2020-04-14 DIAGNOSIS — R2681 Unsteadiness on feet: Secondary | ICD-10-CM | POA: Diagnosis not present

## 2020-04-14 DIAGNOSIS — G35 Multiple sclerosis: Secondary | ICD-10-CM | POA: Diagnosis not present

## 2020-07-08 ENCOUNTER — Ambulatory Visit (INDEPENDENT_AMBULATORY_CARE_PROVIDER_SITE_OTHER): Payer: BC Managed Care – PPO

## 2020-07-08 ENCOUNTER — Encounter: Payer: Self-pay | Admitting: Physician Assistant

## 2020-07-08 ENCOUNTER — Ambulatory Visit (INDEPENDENT_AMBULATORY_CARE_PROVIDER_SITE_OTHER): Payer: BC Managed Care – PPO | Admitting: Physician Assistant

## 2020-07-08 ENCOUNTER — Other Ambulatory Visit: Payer: Self-pay

## 2020-07-08 VITALS — BP 130/56 | HR 74 | Ht 62.0 in | Wt 120.0 lb

## 2020-07-08 DIAGNOSIS — W009XXA Unspecified fall due to ice and snow, initial encounter: Secondary | ICD-10-CM | POA: Diagnosis not present

## 2020-07-08 DIAGNOSIS — Z131 Encounter for screening for diabetes mellitus: Secondary | ICD-10-CM

## 2020-07-08 DIAGNOSIS — R7301 Impaired fasting glucose: Secondary | ICD-10-CM

## 2020-07-08 DIAGNOSIS — R002 Palpitations: Secondary | ICD-10-CM

## 2020-07-08 DIAGNOSIS — M79645 Pain in left finger(s): Secondary | ICD-10-CM | POA: Diagnosis not present

## 2020-07-08 DIAGNOSIS — Z13220 Encounter for screening for lipoid disorders: Secondary | ICD-10-CM

## 2020-07-08 DIAGNOSIS — R079 Chest pain, unspecified: Secondary | ICD-10-CM | POA: Diagnosis not present

## 2020-07-08 DIAGNOSIS — W19XXXA Unspecified fall, initial encounter: Secondary | ICD-10-CM

## 2020-07-08 MED ORDER — DICLOFENAC SODIUM 1 % EX GEL: 4.0000 g | Freq: Four times a day (QID) | CUTANEOUS | 1 refills | Status: DC

## 2020-07-08 NOTE — Progress Notes (Signed)
Subjective:    Patient ID: Destiny King, female    DOB: 05/28/1966, 55 y.o.   MRN: [id number]  HPI  Pt is a 55 yo female with Migraines, Epilepsy, CNS demyelination, seizures who presents to the clinic with intermittent right sided chest pain for last 3 months that is worsening. She did notice it right after her mammogram starting. No tenderness to palpation. Mammogram was normal. She did have covid but was having these symptoms before covid. Hx of palpitations with no treatment. CP does not always come with exertion. CP walking or sitting. No SOB or wheezing. No injury. Nothing seems to make better. Palpitations seem to be worse at night. She has had heart monitor before that did not show anything.   Fell 7 days ago on ice. Landed on left thumb palm side. Continues to have pain and tenderness to touch. Seems a little better but concerned about break. Iced a few days.   .. Active Ambulatory Problems    Diagnosis Date Noted  . COMMON MIGRAINE 08/29/2007  . ALLERGIC RHINITIS 01/30/2009  . NUMBNESS, HAND 08/29/2007  . ANEMIA, IRON DEFICIENCY, HX OF 08/29/2007  . ANXIETY 07/02/2010  . Generalized convulsive epilepsy (HCC) 02/14/2011  . Absence seizure (HCC) 02/14/2011  . Rectal bleeding 01/12/2014  . Change in stool 01/12/2014  . H. pylori infection 02/04/2014  . Diverticulosis 02/04/2014  . Peptic ulcer disease 07/01/2014  . Dense breast tissue 08/26/2014  . Breast cancer screening 08/26/2014  . Allergic rhinitis due to pollen 03/06/2015  . Thyromegaly 03/06/2015  . Other fatigue 03/06/2015  . Difficulty swallowing 03/06/2015  . Seasonal allergies 03/06/2015  . Menopausal symptoms 10/12/2015  . Abnormal mammogram of left breast 10/23/2015  . Hematuria 10/27/2015  . Globus sensation 01/29/2016  . Chronic cystitis 01/29/2016  . Right arm weakness 04/04/2016  . Right leg weakness 04/04/2016  . Left eye pain 05/18/2016  . Lower extremity weakness 06/20/2017  . Seizures (HCC)  06/20/2017  . Crepitus of both knee joints 06/20/2017  . Breast calcification, right 01/02/2018  . CNS demyelination (HCC) 12/02/2016  . White matter abnormality on MRI of brain 07/27/2016   Resolved Ambulatory Problems    Diagnosis Date Noted  . UTI (lower urinary tract infection) 12/29/2010   Past Medical History:  Diagnosis Date  . Anxiety   . Complication of anesthesia   . GERD (gastroesophageal reflux disease)       Review of Systems See HPI.     Objective:   Physical Exam Vitals reviewed.  Constitutional:      Appearance: Normal appearance.  Cardiovascular:     Rate and Rhythm: Normal rate and regular rhythm.     Pulses: Normal pulses.  Pulmonary:     Effort: Pulmonary effort is normal.     Breath sounds: Normal breath sounds.  Musculoskeletal:     Comments: Left tenderness to palpation over CMC joint.  No redness or swelling. No warmth.  NROM at wrist.  Hand grip 5/5.  Negative f[place]in.   Neurological:     General: No focal deficit present.     Mental Status: She is alert and oriented to person, place, and time.  Psychiatric:        Mood and Affect: Mood normal.           Assessment & Plan:  [name] KitchenMarland KitchenDiagnoses and all orders for this visit:  Chest pain, unspecified type -     EKG 12-Lead -     Lipid Panel w/reflex Direct LDL -  COMPLETE METABOLIC PANEL WITH GFR -     TSH -     CBC with Differential/Platelet  Screening for lipid disorders -     Lipid Panel w/reflex Direct LDL  Screening for diabetes mellitus -     COMPLETE METABOLIC PANEL WITH GFR -     Hemoglobin A1C w/out eAG  Fall, initial encounter -     diclofenac Sodium (VOLTAREN) 1 % GEL; Apply 4 g topically 4 (four) times daily. To affected joint. -     DG Finger Thumb Left  Pain of left thumb -     diclofenac Sodium (VOLTAREN) 1 % GEL; Apply 4 g topically 4 (four) times daily. To affected joint. -     DG Finger Thumb Left  Elevated fasting glucose -     Hemoglobin A1C w/out  eAG   Unclear etiology of CP.  EKG did not seem to give a good read. V1 and V2 undistinguished rhythm. ? PAC. No arrhthymias. ? ST elevation in lateral leads.  Fasting labs ordered.  Does not seem to be musculoskeletal.  HO on palpitations triggers.  Referral to cardiology likely needs stress test and zio patch for further cardiac evaluation.   STAT xray showed no fracture. Continue icing, NsAIDs, rest. Follow up with sports medicine as needed. Ok for a few days in a thumb spica if bothering her.

## 2020-07-08 NOTE — Progress Notes (Signed)
No fracture or acute injury. Pain is likely from contusion and some strain of ligaments in the hand.

## 2020-07-08 NOTE — Patient Instructions (Signed)
Get xray.  Get labs.  Will call with stress test.

## 2020-07-09 ENCOUNTER — Encounter: Payer: Self-pay | Admitting: Physician Assistant

## 2020-07-09 NOTE — Progress Notes (Signed)
Lamae,   Cholesterol looks great.  Thyroid normal.  Fasting glucose just a little elevated. Add A1C to better evaluate.  No anemia.  Globulin a little low. Its a protein. Can recheck at some point to make sure not dropping way down but just a hair low.

## 2020-07-10 LAB — CBC WITH DIFFERENTIAL/PLATELET
Absolute Monocytes: 384 cells/uL (ref 200–950)
Basophils Absolute: 30 cells/uL (ref 0–200)
Basophils Relative: 0.8 %
Eosinophils Absolute: 61 cells/uL (ref 15–500)
Eosinophils Relative: 1.6 %
HCT: 37.3 % (ref 35.0–45.0)
Hemoglobin: 12.7 g/dL (ref 11.7–15.5)
Lymphs Abs: 1110 cells/uL (ref 850–3900)
MCH: 32.2 pg (ref 27.0–33.0)
MCHC: 34 g/dL (ref 32.0–36.0)
MCV: 94.7 fL (ref 80.0–100.0)
MPV: 10.6 fL (ref 7.5–12.5)
Monocytes Relative: 10.1 %
Neutro Abs: 2215 cells/uL (ref 1500–7800)
Neutrophils Relative %: 58.3 %
Platelets: 201 10*3/uL (ref 140–400)
RBC: 3.94 10*6/uL (ref 3.80–5.10)
RDW: 11.1 % (ref 11.0–15.0)
Total Lymphocyte: 29.2 %
WBC: 3.8 10*3/uL (ref 3.8–10.8)

## 2020-07-10 LAB — COMPLETE METABOLIC PANEL WITH GFR
AG Ratio: 2.5 (calc) (ref 1.0–2.5)
ALT: 16 U/L (ref 6–29)
AST: 17 U/L (ref 10–35)
Albumin: 4.5 g/dL (ref 3.6–5.1)
Alkaline phosphatase (APISO): 67 U/L (ref 37–153)
BUN: 10 mg/dL (ref 7–25)
CO2: 28 mmol/L (ref 20–32)
Calcium: 9.2 mg/dL (ref 8.6–10.4)
Chloride: 103 mmol/L (ref 98–110)
Creat: 0.79 mg/dL (ref 0.50–1.05)
GFR, Est African American: 99 mL/min/{1.73_m2} (ref >=60)
GFR, Est Non African American: 85 mL/min/{1.73_m2} (ref >=60)
Globulin: 1.8 g/dL (calc) — ABNORMAL LOW (ref 1.9–3.7)
Glucose, Bld: 101 mg/dL — ABNORMAL HIGH (ref 65–99)
Potassium: 4.3 mmol/L (ref 3.5–5.3)
Sodium: 140 mmol/L (ref 135–146)
Total Bilirubin: 0.3 mg/dL (ref 0.2–1.2)
Total Protein: 6.3 g/dL (ref 6.1–8.1)

## 2020-07-10 LAB — LIPID PANEL W/REFLEX DIRECT LDL
Cholesterol: 174 mg/dL (ref <200)
HDL: 76 mg/dL (ref >=50)
LDL Cholesterol (Calc): 86 mg/dL (calc)
Non-HDL Cholesterol (Calc): 98 mg/dL (calc) (ref <130)
Total CHOL/HDL Ratio: 2.3 (calc) (ref <5.0)
Triglycerides: 50 mg/dL (ref <150)

## 2020-07-10 LAB — TSH: TSH: 2.36 mIU/L

## 2020-07-10 LAB — HEMOGLOBIN A1C W/OUT EAG: Hgb A1c MFr Bld: 5 % of total Hgb (ref <5.7)

## 2020-07-10 NOTE — Progress Notes (Signed)
A1c is great. No signs of diabetes.

## 2020-07-13 ENCOUNTER — Encounter: Payer: Self-pay | Admitting: Physician Assistant

## 2020-07-13 DIAGNOSIS — R002 Palpitations: Secondary | ICD-10-CM

## 2020-07-13 DIAGNOSIS — M79645 Pain in left finger(s): Secondary | ICD-10-CM

## 2020-07-13 DIAGNOSIS — R079 Chest pain, unspecified: Secondary | ICD-10-CM

## 2020-07-13 DIAGNOSIS — R0789 Other chest pain: Secondary | ICD-10-CM | POA: Insufficient documentation

## 2020-07-13 HISTORY — DX: Chest pain, unspecified: R07.9

## 2020-07-13 HISTORY — DX: Palpitations: R00.2

## 2020-07-13 HISTORY — DX: Pain in left finger(s): M79.645

## 2020-07-14 ENCOUNTER — Encounter: Payer: Self-pay | Admitting: Physician Assistant

## 2020-07-14 DIAGNOSIS — R079 Chest pain, unspecified: Secondary | ICD-10-CM

## 2020-07-15 ENCOUNTER — Encounter: Payer: Self-pay | Admitting: Physician Assistant

## 2020-07-18 ENCOUNTER — Other Ambulatory Visit (HOSPITAL_COMMUNITY)
Admission: RE | Admit: 2020-07-18 | Discharge: 2020-07-18 | Disposition: A | Discharge status: Home / Self Care | Payer: BC Managed Care – PPO | Source: Ambulatory Visit | Attending: Physician Assistant | Admitting: Physician Assistant

## 2020-07-18 DIAGNOSIS — Z01812 Encounter for preprocedural laboratory examination: Secondary | ICD-10-CM | POA: Insufficient documentation

## 2020-07-18 DIAGNOSIS — Z20822 Contact with and (suspected) exposure to covid-19: Secondary | ICD-10-CM | POA: Insufficient documentation

## 2020-07-18 LAB — SARS CORONAVIRUS 2 (TAT 6-24 HRS): SARS Coronavirus 2: NEGATIVE

## 2020-07-20 ENCOUNTER — Ambulatory Visit: Payer: BC Managed Care – PPO | Admitting: Cardiology

## 2020-07-20 NOTE — Progress Notes (Signed)
Negative for covid

## 2020-07-21 ENCOUNTER — Telehealth (HOSPITAL_COMMUNITY): Payer: Self-pay | Admitting: *Deleted

## 2020-07-21 NOTE — Telephone Encounter (Signed)
Close encounter 

## 2020-07-22 ENCOUNTER — Encounter (HOSPITAL_COMMUNITY): Payer: Self-pay | Admitting: *Deleted

## 2020-07-22 ENCOUNTER — Other Ambulatory Visit: Payer: Self-pay

## 2020-07-22 ENCOUNTER — Ambulatory Visit (HOSPITAL_COMMUNITY)
Admission: RE | Admit: 2020-07-22 | Discharge: 2020-07-22 | Disposition: A | Discharge status: Home / Self Care | Payer: BC Managed Care – PPO | Source: Ambulatory Visit | Attending: Cardiovascular Disease | Admitting: Cardiovascular Disease

## 2020-07-22 DIAGNOSIS — R079 Chest pain, unspecified: Secondary | ICD-10-CM | POA: Diagnosis not present

## 2020-07-22 LAB — EXERCISE TOLERANCE TEST
Estimated workload: 10.2 METS
Exercise duration (min): 9 min
Exercise duration (sec): 6 s
MPHR: 167 {beats}/min
Peak HR: 133 {beats}/min
Percent HR: 79 %
Rest HR: 71 {beats}/min

## 2020-07-22 NOTE — Telephone Encounter (Signed)
Call pt: she did have abnormal stress test and needs to see cardiology to order additional test to evaluate her heart. I had previously made referral for cardiology. Call that office and r/s appt.

## 2020-07-22 NOTE — Progress Notes (Unsigned)
Abnormal ETT was reviewed by Dr. [place]. Patient was given the ok to be discharged to go home. 

## 2020-07-24 DIAGNOSIS — F419 Anxiety disorder, unspecified: Secondary | ICD-10-CM | POA: Insufficient documentation

## 2020-07-24 DIAGNOSIS — T8859XA Other complications of anesthesia, initial encounter: Secondary | ICD-10-CM | POA: Insufficient documentation

## 2020-07-24 DIAGNOSIS — K219 Gastro-esophageal reflux disease without esophagitis: Secondary | ICD-10-CM | POA: Insufficient documentation

## 2020-07-27 ENCOUNTER — Encounter: Payer: Self-pay | Admitting: Cardiology

## 2020-07-27 ENCOUNTER — Other Ambulatory Visit: Payer: Self-pay

## 2020-07-27 ENCOUNTER — Ambulatory Visit: Payer: BC Managed Care – PPO | Admitting: Cardiology

## 2020-07-27 DIAGNOSIS — R0600 Dyspnea, unspecified: Secondary | ICD-10-CM | POA: Diagnosis not present

## 2020-07-27 DIAGNOSIS — I259 Chronic ischemic heart disease, unspecified: Secondary | ICD-10-CM | POA: Diagnosis not present

## 2020-07-27 DIAGNOSIS — I209 Angina pectoris, unspecified: Secondary | ICD-10-CM | POA: Diagnosis not present

## 2020-07-27 DIAGNOSIS — R9439 Abnormal result of other cardiovascular function study: Secondary | ICD-10-CM

## 2020-07-27 DIAGNOSIS — R0609 Other forms of dyspnea: Secondary | ICD-10-CM

## 2020-07-27 MED ORDER — PREDNISONE 50 MG PO TABS: ORAL_TABLET | ORAL | 0 refills | Status: DC

## 2020-07-27 MED ORDER — METOPROLOL TARTRATE 100 MG PO TABS: 100.0000 mg | ORAL_TABLET | Freq: Once | ORAL | 0 refills | Status: DC

## 2020-07-27 NOTE — Progress Notes (Signed)
Cardiology Office Note:    Date:  07/27/2020   ID:  Destiny King, DOB [birth year], MRN [id number]  PCP:  Destiny King  Cardiologist:  Destiny Brothers, King   Referring King: Destiny King    ASSESSMENT:    1. Angina pectoris (HCC)   2. Dyspnea on exertion   3. Abnormal stress test    PLAN:    In order of problems listed above:  1. Primary prevention stressed with the patient.  Importance of compliance with diet medication stressed and she vocalized understanding.  She has good effort tolerance.  She has dyspnea on exertion. 2. Angina pectoris: Dyspnea on exertion: Multiple modalities discussed including stress testing with pharmacological agent, CT coronary angiography conventional and with FFR.  She prefers the latter.  I respect her wishes and will schedule her for the same.  She has hardly any risk factors for coronary artery disease.  He gives family history of coronary artery disease.  She appears to be a anxious lady and she has concerns about the same symptoms and it has affected her quality of life. 3. Dyspnea on exertion: As mentioned above this could be an anginal equivalent. 4. Further recommendations will be made based on the finding of the aforementioned test.  She knows to go to the nearest emergency room for any concerning symptoms.Patient will be seen in follow-up appointment in 6 months or earlier if the patient has any concerns    Medication Adjustments/Labs and Tests Ordered: Current medicines are reviewed at length with the patient today.  Concerns regarding medicines are outlined above.  No orders of the defined types were placed in this encounter.  No orders of the defined types were placed in this encounter.    History of Present Illness:    Destiny King is a 55 y.o. female who is being seen today for the evaluation of dyspnea on exertion, chest pain and abnormal stress test at the request of Breeback, Destiny King.  Patient is a  pleasant 55 year old female.  Her husband accompanies her for visit.  She has history of anxiety, dyspnea on exertion and chest pain.  Patient mentions to me that she underwent stress testing and this was abnormal.  The report is mentioned below.  During the stress test she had left bundle branch block and ST depressions and for this reason she was referred here.  She tells me that this concerns also she has dyspnea on exertion that has affected quality of life.  She is significantly anxious about it.  At the time of my evaluation, the patient is alert awake oriented and in no distress.  Past Medical History:  Diagnosis Date  . Abnormal mammogram of left breast 10/23/2015   Pathology revealed pseudoangiomatous stromal hyperplasia and fibrocystic changes in the left breast. 10/2015.   Formatting of this note might be different from the original. Overview:  Pathology revealed pseudoangiomatous stromal hyperplasia and fibrocystic changes in the left breast. 10/2015.  [name] Kitchen Absence seizure (HCC) 02/14/2011   [facility] in [place].   Formatting of this note might be different from the original. Overview:  [facility] in [place].  . Allergic rhinitis 01/30/2009   Qualifier: Diagnosis of  By: Destiny King, Destiny King of this note might be different from the original. Overview:  Qualifier: Diagnosis of  By: Destiny King, Destiny [facility]  . Allergic rhinitis due to pollen 03/06/2015  . ANEMIA, IRON DEFICIENCY, HX OF 08/29/2007  Qualifier: Diagnosis of  By: Destiny King, Destiny King    . Anxiety   . ANXIETY 07/02/2010   Qualifier: Diagnosis of  By: Destiny King, Destiny King    . Breast calcification, right 01/02/2018   Diagnostic mammogram done 01/2018 suggested 6 month follow up.  F/U 07/2018 suggested follow up in 6 months.   Formatting of this note might be different from the original. Diagnostic mammogram done 01/2018 suggested 6 month follow up.  F/U 07/2018 suggested  follow up in 6 months.  . Breast cancer screening 08/26/2014  . Change in stool 01/12/2014  . Chest pain 07/13/2020  . Chronic cystitis 01/29/2016  . Chronic low back pain 11/14/2016   Formatting of this note is different from the original. ---June 2018-MRI---Mild multilevel degenerative changes of the lower lumbar spine with mild bilateral foraminal stenosis at L4-L5 with facet hypertrophy abutting the exiting left L4 nerve roots.  Formatting of this note is different from the original. Formatting of this note might be different from the original. ---June 2018-MRI---Mild multi  . CNS demyelination (HCC) 12/02/2016  . COMMON MIGRAINE 08/29/2007   Qualifier: Diagnosis of  By: Destiny King, Destiny King    . Complication of anesthesia    states she is very sensitive to "sedating" drugs  . COVID 03/30/2020  . Crepitus of both knee joints 06/20/2017  . Dense breast tissue 08/26/2014  . Difficulty swallowing 03/06/2015  . Generalized convulsive epilepsy (HCC) 02/14/2011   Sees Destiny King at [facility].   Formatting of this note might be different from the original. Overview:  Sees [facility] at [facility].  [name] Kitchen GERD (gastroesophageal reflux disease)   . Globus sensation 01/29/2016  . H. pylori infection 02/04/2014   Confirmed with EGD 01/30/14  Formatting of this note might be different from the original. Overview:  Confirmed with EGD 01/30/14  . Hematuria 10/27/2015   Cystoscopy normal. Work up normal urology.    . Left eye pain 05/18/2016   Seem by Destiny King, pending normal MRI suggested treatment for trigeminal neuralgia.   . Lower extremity weakness 06/20/2017  . Menopausal symptoms 10/12/2015  . Neck pain 07/27/2016   Formatting of this note might be different from the original. --Dec 2017-MRI---No acute abnormality. Scattered T2 white matter lesions likely result of prior trauma or migraine, less likely vasculitis or demyelinating process.  No abnormal enhancement.     --Dec 2017-EEG--- generalized spike and slow wave discharges consistent with petit mal epilepsy ---Dec 2017-EMG / PNCV--- negative for cervical   . Numbness and tingling of both legs 07/27/2016   Formatting of this note is different from the original. ---June 2018-MRI---Mild multilevel degenerative changes of the lower lumbar spine with mild bilateral foraminal stenosis at L4-L5 with facet hypertrophy abutting the exiting left L4 nerve roots.   --Dec 2017-MRI---No acute abnormality. Scattered T2 white matter lesions likely result of prior trauma or migraine, less likely vasculitis or demy  . NUMBNESS, HAND 08/29/2007   Annotation: Left Qualifier: Diagnosis of  By: Destiny King, [facility]    . Other fatigue 03/06/2015  . Pain of left thumb 07/13/2020  . Palpitations 07/13/2020  . Peptic ulcer disease 07/01/2014   Avoid NSAIDs.  Benign colonic mucosa- 01/30/14 Colonoscopy every 5 years.   Formatting of this note might be different from the original. Overview:  Avoid NSAIDs.  Benign colonic mucosa- 01/30/14 Colonoscopy every 5 years.  . Rectal bleeding 01/12/2014  . Right arm weakness 04/04/2016   EMG  cannot confirm peripheral neuropathy. 05/2016.   . Right leg weakness 04/04/2016   EMG cannot confirm lumbar radicuolopathy of either leg. 05/2016.   Formatting of this note might be different from the original. Overview:  EMG cannot confirm lumbar radicuolopathy of either leg. 05/2016.  . Seasonal allergies 03/06/2015  . Seizures (HCC)    as young child  . Thyromegaly 03/06/2015  . Tingling 07/27/2016  . White matter abnormality on MRI of brain 07/27/2016    Past Surgical History:  Procedure Laterality Date  . ABDOMINAL HYSTERECTOMY     partial  . BREAST BIOPSY Left    needle core biopsy  . BREAST LUMPECTOMY WITH RADIOACTIVE SEED LOCALIZATION Left 11/13/2015   Procedure: BREAST LUMPECTOMY WITH LEFT RADIOACTIVE SEED LOCALIZATION;  Surgeon: Glenna Fellows, King;  Location: [facility];  Service: General;  Laterality: Left;  . CHOLECYSTECTOMY    . TUBAL LIGATION    . tubal reversal  2005    Current Medications: Current Meds  Medication Sig  . Evening Primrose Oil 500 MG CAPS Take 500 mg by mouth daily.  [name] Kitchen lamoTRIgine (LAMICTAL) 100 MG tablet Take 100 mg by mouth 2 (two) times daily.  [name] Kitchen levETIRAcetam (KEPPRA) 250 MG tablet Take 250 mg by mouth 2 (two) times daily.  . Magnesium 500 MG CAPS Take 500 mg by mouth daily.     Allergies:   Cefdinir, Cetirizine, Ciprofloxacin, Meperidine, Meperidine hcl, Morphine, Iodinated diagnostic agents, Topiramate, Meperidine hcl, and Morphine and related   Social History   Socioeconomic History  . Marital status: Married    Spouse name: Not on file  . Number of children: Not on file  . Years of education: Not on file  . Highest education level: Not on file  Occupational History  . Not on file  Tobacco Use  . Smoking status: Never Smoker  . Smokeless tobacco: Never Used  Substance and Sexual Activity  . Alcohol use: No  . Drug use: No  . Sexual activity: Yes    Birth control/protection: Surgical  Other Topics Concern  . Not on file  Social History Narrative  . Not on file   Social Determinants of Health   Financial Resource Strain: Not on file  Food Insecurity: Not on file  Transportation Needs: Not on file  Physical Activity: Not on file  Stress: Not on file  Social Connections: Not on file     Family History: The patient's family history includes Cancer in her father and mother; Hypertension in her sister.  ROS:   Please see the history of present illness.    All other systems reviewed and are negative.  EKGs/Labs/Other Studies Reviewed:    The following studies were reviewed today: EKG reveals sinus rhythm and nonspecific ST-T changes.   Recent Labs: 07/08/2020: ALT 16; BUN 10; Creat 0.79; Hemoglobin 12.7; Platelets 201; Potassium 4.3; Sodium 140; TSH 2.36  Recent Lipid Panel     Component Value Date/Time   CHOL 174 07/08/2020 0000   TRIG 50 07/08/2020 0000   HDL 76 07/08/2020 0000   CHOLHDL 2.3 07/08/2020 0000   VLDL 12 02/24/2016 1040   LDLCALC 86 07/08/2020 0000    Physical Exam:    VS:  BP 126/82   Pulse 80   Ht 5\' 2"  (1.575 m)   Wt 118 lb 1.3 oz (53.6 kg)   LMP 12/18/2010   SpO2 98%   BMI 21.60 kg/m     Wt Readings from Last 3 Encounters:  07/27/20 118  lb 1.3 oz (53.6 kg)  07/08/20 120 lb (54.4 kg)  08/17/18 116 lb (52.6 kg)     GEN: Patient is in no acute distress HEENT: Normal NECK: No JVD; No carotid bruits LYMPHATICS: No lymphadenopathy CARDIAC: S1 S2 regular, 2/6 systolic murmur at the apex. RESPIRATORY:  Clear to auscultation without rales, wheezing or rhonchi  ABDOMEN: Soft, non-tender, non-distended MUSCULOSKELETAL:  No edema; No deformity  SKIN: Warm and dry NEUROLOGIC:  Alert and oriented x 3 PSYCHIATRIC:  Normal affect    Signed, Destiny Brothers, King  07/27/2020 11:12 AM    [facility]

## 2020-07-27 NOTE — Patient Instructions (Signed)
Medication Instructions:  No medication changes. *If you need a refill on your cardiac medications before your next appointment, please call your pharmacy*   Lab Work: None ordered If you have labs (blood work) drawn today and your tests are completely normal, you will receive your results only by: [name] Kitchen MyChart Message (if you have MyChart) OR . A paper copy in the mail If you have any lab test that is abnormal or we need to change your treatment, we will call you to review the results.   Testing/Procedures: Your cardiac CT will be scheduled at:   [facility] [place] [address] [phone]   If scheduled at [facility], please arrive at the [facility] main entrance of [facility] 30 minutes prior to test start time. Proceed to the [facility] Radiology Department (first floor) to check-in and test prep.  Please follow these instructions carefully (unless otherwise directed):  On the Night Before the Test: . Be sure to Drink plenty of water. . Do not consume any caffeinated/decaffeinated beverages or chocolate 12 hours prior to your test. . Do not take any antihistamines 12 hours prior to your test. . If the patient has contrast allergy: ? Patient will need a prescription for Prednisone and very clear instructions (as follows): 1. Prednisone 50 mg - take 13 hours prior to test 2. Take another Prednisone 50 mg 7 hours prior to test 3. Take another Prednisone 50 mg 1 hour prior to test 4. Take Benadryl 50 mg 1 hour prior to test . Patient must complete all four doses of above prophylactic medications. . Patient will need a ride after test due to Benadryl.  On the Day of the Test: . Drink plenty of water. Do not drink any water within one hour of the test. . Do not eat any food 4 hours prior to the test. . You may take your regular medications prior to the test.  . Take metoprolol (Lopressor) two hours prior to  test. . FEMALES- please wear underwire-free bra if available    After the Test: . Drink plenty of water. . After receiving IV contrast, you may experience a mild flushed feeling. This is normal. . On occasion, you may experience a mild rash up to 24 hours after the test. This is not dangerous. If this occurs, you can take Benadryl 25 mg and increase your fluid intake. . If you experience trouble breathing, this can be serious. If it is severe call 911 IMMEDIATELY. If it is mild, please call our office.   Once we have confirmed authorization from your insurance company, we will call you to set up a date and time for your test. Based on how quickly your insurance processes prior authorizations requests, please allow up to 4 weeks to be contacted for scheduling your Cardiac CT appointment. Be advised that routine Cardiac CT appointments could be scheduled as many as 8 weeks after your provider has ordered it.  For non-scheduling related questions, please contact the cardiac imaging nurse navigator should you have any questions/concerns: Marchia Bond, Cardiac Imaging Nurse Navigator Burley Saver, Interim Cardiac Imaging Nurse [place] [facility] Direct Office Dial: [phone]   For scheduling needs, including cancellations and rescheduling, please call Vivien Rota at [phone].     Follow-Up: At [facility], you and your health needs are our priority.  As part of our continuing mission to provide you with exceptional heart care, we have created designated  Provider Care Teams.  These Care Teams include your primary Cardiologist (physician) and Advanced Practice Providers (APPs -  Physician Assistants and Nurse Practitioners) who all work together to provide you with the care you need, when you need it.  We recommend signing up for the patient portal called "MyChart".  Sign up information is provided on this After Visit Summary.  MyChart is used to connect with patients  for Virtual Visits (Telemedicine).  Patients are able to view lab/test results, encounter notes, upcoming appointments, etc.  Non-urgent messages can be sent to your provider as well.   To learn more about what you can do with MyChart, go to [email or web address].    Your next appointment:   6 month(s)  The format for your next appointment:   In Person  Provider:   Jyl Heinz, MD   Other Instructions Cardiac CT Angiogram A cardiac CT angiogram is a procedure to look at the heart and the area around the heart. It may be done to help find the cause of chest pains or other symptoms of heart disease. During this procedure, a substance called contrast dye is injected into the blood vessels in the area to be checked. A large X-ray machine, called a CT scanner, then takes detailed pictures of the heart and the surrounding area. The procedure is also sometimes called a coronary CT angiogram, coronary artery scanning, or CTA. A cardiac CT angiogram allows the health care provider to see how well blood is flowing to and from the heart. The health care provider will be able to see if there are any problems, such as:  Blockage or narrowing of the coronary arteries in the heart.  Fluid around the heart.  Signs of weakness or disease in the muscles, valves, and tissues of the heart. Tell a health care provider about:  Any allergies you have. This is especially important if you have had a previous allergic reaction to contrast dye.  All medicines you are taking, including vitamins, herbs, eye drops, creams, and over-the-counter medicines.  Any blood disorders you have.  Any surgeries you have had.  Any medical conditions you have.  Whether you are pregnant or may be pregnant.  Any anxiety disorders, chronic pain, or other conditions you have that may increase your stress or prevent you from lying still. What are the risks? Generally, this is a safe procedure. However, problems may  occur, including: 1. Bleeding. 2. Infection. 3. Allergic reactions to medicines or dyes. 4. Damage to other structures or organs. 5. Kidney damage from the contrast dye that is used. 6. Increased risk of cancer from radiation exposure. This risk is low. Talk with your health care provider about: ? The risks and benefits of testing. ? How you can receive the lowest dose of radiation. What happens before the procedure? 1. Wear comfortable clothing and remove any jewelry, glasses, dentures, and hearing aids. 2. Follow instructions from your health care provider about eating and drinking. This may include: ? For 12 hours before the procedure - avoid caffeine. This includes tea, coffee, soda, energy drinks, and diet pills. Drink plenty of water or other fluids that do not have caffeine in them. Being well hydrated can prevent complications. ? For 4-6 hours before the procedure - stop eating and drinking. The contrast dye can cause nausea, but this is less likely if your stomach is empty. 3. Ask your health care provider about changing or stopping your regular medicines. This is especially important if you are taking diabetes  medicines, blood thinners, or medicines to treat problems with erections (erectile dysfunction). What happens during the procedure?  1. Hair on your chest may need to be removed so that small sticky patches called electrodes can be placed on your chest. These will transmit information that helps to monitor your heart during the procedure. 2. An IV will be inserted into one of your veins. 3. You might be given a medicine to control your heart rate during the procedure. This will help to ensure that good images are obtained. 4. You will be asked to lie on an exam table. This table will slide in and out of the CT machine during the procedure. 5. Contrast dye will be injected into the IV. You might feel warm, or you may get a metallic taste in your mouth. 6. You will be given a  medicine called nitroglycerin. This will relax or dilate the arteries in your heart. 7. The table that you are lying on will move into the CT machine tunnel for the scan. 8. The person running the machine will give you instructions while the scans are being done. You may be asked to: ? Keep your arms above your head. ? Hold your breath. ? Stay very still, even if the table is moving. 9. When the scanning is complete, you will be moved out of the machine. 10. The IV will be removed. The procedure may vary among health care providers and hospitals. What can I expect after the procedure? After your procedure, it is common to have:  A metallic taste in your mouth from the contrast dye.  A feeling of warmth.  A headache from the nitroglycerin. Follow these instructions at home:  Take over-the-counter and prescription medicines only as told by your health care provider.  If you are told, drink enough fluid to keep your urine pale yellow. This will help to flush the contrast dye out of your body.  Most people can return to their normal activities right after the procedure. Ask your health care provider what activities are safe for you.  It is up to you to get the results of your procedure. Ask your health care provider, or the department that is doing the procedure, when your results will be ready.  Keep all follow-up visits as told by your health care provider. This is important. Contact a health care provider if: 1. You have any symptoms of allergy to the contrast dye. These include: ? Shortness of breath. ? Rash or hives. ? A racing heartbeat. Summary  A cardiac CT angiogram is a procedure to look at the heart and the area around the heart. It may be done to help find the cause of chest pains or other symptoms of heart disease.  During this procedure, a large X-ray machine, called a CT scanner, takes detailed pictures of the heart and the surrounding area after a contrast dye has  been injected into blood vessels in the area.  Ask your health care provider about changing or stopping your regular medicines before the procedure. This is especially important if you are taking diabetes medicines, blood thinners, or medicines to treat erectile dysfunction.  If you are told, drink enough fluid to keep your urine pale yellow. This will help to flush the contrast dye out of your body. This information is not intended to replace advice given to you by your health care provider. Make sure you discuss any questions you have with your health care provider. Document Revised: 01/16/2019 Document Reviewed:  01/16/2019 Elsevier Patient Education  2020 [employer].

## 2020-08-04 ENCOUNTER — Telehealth (HOSPITAL_COMMUNITY): Payer: Self-pay | Admitting: *Deleted

## 2020-08-04 NOTE — Telephone Encounter (Signed)
Reaching out to patient to offer assistance regarding upcoming cardiac imaging study; pt verbalizes understanding of appt date/time, parking situation and where to check in, pre-test NPO status and medications ordered, and verified current allergies; name and call back number provided for further questions should they arise  Nicoli Nardozzi RN Navigator Cardiac Imaging [facility] [phone] office [phone] cell  

## 2020-08-05 ENCOUNTER — Ambulatory Visit (HOSPITAL_COMMUNITY)
Admission: RE | Admit: 2020-08-05 | Discharge: 2020-08-05 | Disposition: A | Discharge status: Home / Self Care | Payer: BC Managed Care – PPO | Source: Ambulatory Visit | Attending: Cardiology | Admitting: Cardiology

## 2020-08-05 ENCOUNTER — Other Ambulatory Visit: Payer: Self-pay

## 2020-08-05 DIAGNOSIS — R9439 Abnormal result of other cardiovascular function study: Secondary | ICD-10-CM | POA: Diagnosis not present

## 2020-08-05 DIAGNOSIS — R0600 Dyspnea, unspecified: Secondary | ICD-10-CM | POA: Diagnosis not present

## 2020-08-05 DIAGNOSIS — I209 Angina pectoris, unspecified: Secondary | ICD-10-CM

## 2020-08-05 DIAGNOSIS — I259 Chronic ischemic heart disease, unspecified: Secondary | ICD-10-CM

## 2020-08-05 DIAGNOSIS — R0609 Other forms of dyspnea: Secondary | ICD-10-CM

## 2020-08-05 MED ORDER — IOHEXOL 350 MG/ML SOLN
80.0000 mL | Freq: Once | INTRAVENOUS | Status: AC | PRN
  Administered 2020-08-05: 80 mL via INTRAVENOUS

## 2020-08-05 MED ORDER — NITROGLYCERIN 0.4 MG SL SUBL
0.8000 mg | SUBLINGUAL_TABLET | Freq: Once | SUBLINGUAL | Status: AC
  Administered 2020-08-05: 0.8 mg via SUBLINGUAL

## 2020-08-05 MED ORDER — NITROGLYCERIN 0.4 MG SL SUBL
SUBLINGUAL_TABLET | SUBLINGUAL | Status: AC
  Filled 2020-08-05: qty 2

## 2020-08-07 NOTE — Progress Notes (Signed)
The coronary morph CTA + 2 FFR orders come as an 'order set'.   Everyone gets the initial coronary morph CTA but the FFR orders are an additional test to evaluate plaque burden/blockage, if needed.  Thankfully your coronary morph CTA did not show any blockages and therefore these FFR orders were not used.   Sorry for any confusion.  Rockwell Alexandria RN Navigator Cardiac Imaging [facility] [phone] Office  [phone] Cell

## 2020-08-10 ENCOUNTER — Telehealth: Payer: Self-pay

## 2020-08-10 NOTE — Telephone Encounter (Signed)
I spoke with her

## 2020-08-10 NOTE — Telephone Encounter (Signed)
Spoke with pt and she had multiple questions regarding this study. Pt had an appt in the past to discuss to abnormal stress test. Could you call her?

## 2020-08-14 ENCOUNTER — Other Ambulatory Visit: Payer: Self-pay

## 2020-08-14 ENCOUNTER — Encounter: Payer: Self-pay | Admitting: Physician Assistant

## 2020-08-14 ENCOUNTER — Ambulatory Visit (INDEPENDENT_AMBULATORY_CARE_PROVIDER_SITE_OTHER): Payer: BC Managed Care – PPO | Admitting: Physician Assistant

## 2020-08-14 VITALS — BP 140/70 | HR 70 | Ht 62.0 in | Wt 122.0 lb

## 2020-08-14 DIAGNOSIS — R9439 Abnormal result of other cardiovascular function study: Secondary | ICD-10-CM

## 2020-08-14 DIAGNOSIS — R079 Chest pain, unspecified: Secondary | ICD-10-CM

## 2020-08-14 DIAGNOSIS — R0609 Other forms of dyspnea: Secondary | ICD-10-CM

## 2020-08-14 DIAGNOSIS — R0600 Dyspnea, unspecified: Secondary | ICD-10-CM

## 2020-08-14 DIAGNOSIS — R002 Palpitations: Secondary | ICD-10-CM

## 2020-08-14 MED ORDER — OMEPRAZOLE 40 MG PO CPDR: 40.0000 mg | DELAYED_RELEASE_CAPSULE | Freq: Every day | ORAL | 1 refills | Status: DC

## 2020-08-14 MED ORDER — ALUM & MAG HYDROXIDE-SIMETH 200-200-20 MG/5ML PO SUSP
30.0000 mL | Freq: Once | ORAL | Status: AC
  Administered 2020-08-14: 30 mL via ORAL

## 2020-08-14 MED ORDER — HYOSCYAMINE SULFATE 0.125 MG PO TBDP
0.1250 mg | ORAL_TABLET | Freq: Once | ORAL | Status: AC
  Administered 2020-08-14: 0.125 mg via SUBLINGUAL

## 2020-08-14 MED ORDER — LIDOCAINE VISCOUS HCL 2 % MT SOLN
15.0000 mL | Freq: Once | OROMUCOSAL | Status: AC
  Administered 2020-08-14: 15 mL via OROMUCOSAL

## 2020-08-14 NOTE — Patient Instructions (Signed)
Star omeprazole daily for 2 weeks.  Will refer for second cardiologist.

## 2020-08-14 NOTE — Progress Notes (Signed)
Subjective:    Patient ID: Destiny King, female    DOB: 19-Sep-1965, 55 y.o.   MRN: [id number]  HPI  Patient is a 55 year old female with ongoing chest pain who presents to the clinic for follow-up.  Patient had an abnormal stress test and was sent to cardiology.  She had a CT coronary morph which was negative for any ischemic blockages.  She was told to follow-up in 6 months with cardiology and with her PCP.  She was unhappy with the visit as she wanted to ask questions and she did not feel like her questions were answered with the cardiologist.  She is worried because she continues to have this episodic chest pain daily.  She usually has it multiple times a day and can happen at rest or with exertion.  It can last for 1 minute to 1 hour.  It is not induced by anxiety and does not seem to be affected by food.  Patient has a history of peptic ulcer disease and GERD.  She is not on any medication for this.  She denies any feelings of acid reflux and has had the [employer] fundoplication procedure in the past.  She denies any melena or hematochezia.  She denies any abdominal pain. She is also not had any chest tenderness to palpation.   .. Active Ambulatory Problems    Diagnosis Date Noted  . COMMON MIGRAINE 08/29/2007  . Allergic rhinitis 01/30/2009  . NUMBNESS, HAND 08/29/2007  . ANEMIA, IRON DEFICIENCY, HX OF 08/29/2007  . ANXIETY 07/02/2010  . Generalized convulsive epilepsy (HCC) 02/14/2011  . Absence seizure (HCC) 02/14/2011  . Rectal bleeding 01/12/2014  . Change in stool 01/12/2014  . H. pylori infection 02/04/2014  . Diverticulosis 02/04/2014  . Peptic ulcer disease 07/01/2014  . Dense breast tissue 08/26/2014  . Breast cancer screening 08/26/2014  . Allergic rhinitis due to pollen 03/06/2015  . Thyromegaly 03/06/2015  . Other fatigue 03/06/2015  . Difficulty swallowing 03/06/2015  . Seasonal allergies 03/06/2015  . Menopausal symptoms 10/12/2015  . Abnormal mammogram of  left breast 10/23/2015  . Hematuria 10/27/2015  . Globus sensation 01/29/2016  . Chronic cystitis 01/29/2016  . Right arm weakness 04/04/2016  . Right leg weakness 04/04/2016  . Left eye pain 05/18/2016  . Lower extremity weakness 06/20/2017  . Seizures (HCC) 06/20/2017  . Crepitus of both knee joints 06/20/2017  . Breast calcification, right 01/02/2018  . CNS demyelination (HCC) 12/02/2016  . White matter abnormality on MRI of brain 07/27/2016  . Chest pain 07/13/2020  . Pain of left thumb 07/13/2020  . Palpitations 07/13/2020  . Anxiety   . Complication of anesthesia   . GERD (gastroesophageal reflux disease)   . Chronic low back pain 11/14/2016  . COVID 03/30/2020  . Numbness and tingling of both legs 07/27/2016  . Tingling 07/27/2016  . Neck pain 07/27/2016  . Angina pectoris (HCC) 07/27/2020  . Dyspnea on exertion 07/27/2020  . Abnormal stress test 07/27/2020   Resolved Ambulatory Problems    Diagnosis Date Noted  . UTI (lower urinary tract infection) 12/29/2010   No Additional Past Medical History      Review of Systems See HPI.     Objective:   Physical Exam Vitals reviewed.  Constitutional:      Appearance: Normal appearance.  HENT:     Head: Normocephalic.  Neck:     Vascular: No carotid bruit.  Cardiovascular:     Rate and Rhythm: Normal rate and regular rhythm.  Heart sounds: Murmur heard.    Pulmonary:     Effort: Pulmonary effort is normal.     Breath sounds: Normal breath sounds.  Chest:     Chest wall: No tenderness.  Abdominal:     General: There is no distension.     Palpations: Abdomen is soft.     Tenderness: There is no abdominal tenderness. There is no right CVA tenderness, left CVA tenderness, guarding or rebound.  Musculoskeletal:     Right lower leg: No edema.     Left lower leg: No edema.  Lymphadenopathy:     Cervical: No cervical adenopathy.  Neurological:     General: No focal deficit present.     Mental Status: She  is alert and oriented to person, place, and time.  Psychiatric:     Comments: Anxious about health.           Assessment & Plan:  [name] KitchenMarland KitchenRadiance was seen today for follow-up.  Diagnoses and all orders for this visit:  Chest pain, unspecified type -     omeprazole (PRILOSEC) 40 MG capsule; Take 1 capsule (40 mg total) by mouth daily. -     H. pylori breath test -     alum & mag hydroxide-simeth (MAALOX/MYLANTA) 200-200-20 MG/5ML suspension 30 mL -     hyoscyamine (ANASPAZ) disintergrating tablet 0.125 mg -     lidocaine (XYLOCAINE) 2 % viscous mouth solution 15 mL -     Ambulatory referral to Cardiology  Abnormal stress test -     Ambulatory referral to Cardiology  Dyspnea on exertion -     Ambulatory referral to Cardiology    Another referral was made for 2nd opinion for cardiology. She has lots of unanswered questions. Reassured her that her CP since CT morph negative was not ischemic.   Consider GERD. She has a hx of this and past hx of nisson procedure. H.pylori breath test done today.  GI cocktail given.  Omeprazole started. Stay on for at least 2 weeks.  Spent 30 minutes with patient reviewing chart, discussing work up and discussing current treatment plan.   Consider anxiety. CP not related to stress. Pt states it does not feel like anxiety.

## 2020-08-17 LAB — H. PYLORI BREATH TEST: H. pylori Breath Test: NOT DETECTED

## 2020-08-17 NOTE — Progress Notes (Signed)
Negative for h pylori

## 2020-08-27 ENCOUNTER — Other Ambulatory Visit: Payer: Self-pay | Admitting: Physician Assistant

## 2020-08-27 DIAGNOSIS — R079 Chest pain, unspecified: Secondary | ICD-10-CM

## 2020-09-15 DIAGNOSIS — H469 Unspecified optic neuritis: Secondary | ICD-10-CM | POA: Diagnosis not present

## 2021-01-25 ENCOUNTER — Ambulatory Visit: Payer: BC Managed Care – PPO | Admitting: Cardiology

## 2021-03-31 ENCOUNTER — Other Ambulatory Visit: Payer: Self-pay | Admitting: Physician Assistant

## 2021-03-31 DIAGNOSIS — Z1231 Encounter for screening mammogram for malignant neoplasm of breast: Secondary | ICD-10-CM

## 2021-04-01 ENCOUNTER — Ambulatory Visit (INDEPENDENT_AMBULATORY_CARE_PROVIDER_SITE_OTHER): Payer: BC Managed Care – PPO

## 2021-04-01 ENCOUNTER — Other Ambulatory Visit: Payer: Self-pay

## 2021-04-01 DIAGNOSIS — Z1231 Encounter for screening mammogram for malignant neoplasm of breast: Secondary | ICD-10-CM | POA: Diagnosis not present

## 2021-04-05 NOTE — Progress Notes (Signed)
Normal mammogram. Follow up in 1 year.

## 2021-05-19 ENCOUNTER — Telehealth (INDEPENDENT_AMBULATORY_CARE_PROVIDER_SITE_OTHER): Payer: BC Managed Care – PPO | Admitting: Sports Medicine

## 2021-05-19 ENCOUNTER — Encounter: Payer: Self-pay | Admitting: Physician Assistant

## 2021-05-19 ENCOUNTER — Other Ambulatory Visit: Payer: Self-pay | Admitting: Sports Medicine

## 2021-05-19 DIAGNOSIS — R519 Headache, unspecified: Secondary | ICD-10-CM

## 2021-05-19 MED ORDER — ESOMEPRAZOLE MAGNESIUM 40 MG PO CPDR: DELAYED_RELEASE_CAPSULE | ORAL | 3 refills | Status: DC

## 2021-05-19 MED ORDER — DEXAMETHASONE 4 MG PO TABS: 4.0000 mg | ORAL_TABLET | Freq: Three times a day (TID) | ORAL | 0 refills | Status: DC

## 2021-05-19 MED ORDER — KETOROLAC TROMETHAMINE 10 MG PO TABS: 10.0000 mg | ORAL_TABLET | Freq: Three times a day (TID) | ORAL | 0 refills | Status: DC | PRN

## 2021-05-19 MED ORDER — PROMETHAZINE HCL 25 MG PO TABS: 25.0000 mg | ORAL_TABLET | Freq: Four times a day (QID) | ORAL | 3 refills | Status: AC | PRN

## 2021-05-19 NOTE — Progress Notes (Signed)
° °  Virtual Visit via Telephone   I connected with  Destiny King  on 05/19/21 by telephone/telehealth and verified that I am speaking with the correct person using two identifiers.   I discussed the limitations, risks, security and privacy concerns of performing an evaluation and management service by telephone, including the higher likelihood of inaccurate diagnosis and treatment, and the availability of in person appointments.  We also discussed the likely need of an additional face to face encounter for complete and high quality delivery of care.  I also discussed with the patient that there may be a patient responsible charge related to this service. The patient expressed understanding and wishes to proceed.  Provider location is in medical facility. Patient location is at their home, different from provider location. People involved in care of the patient during this telehealth encounter were myself, my nurse/medical assistant, and my front office/scheduling team member.  Review of Systems: No fevers, chills, night sweats, weight loss, chest pain, or shortness of breath.   Objective Findings:    General: Speaking full sentences, no audible heavy breathing.  Sounds alert and appropriately interactive.    Independent interpretation of tests performed by another provider:   None.  Brief History, Exam, Impression, and Recommendations:    Frequent headaches This is a pleasant 55 year old female with a history of complex partial seizures, she was diagnosed with COVID on December 3, symptomatology was predominately fevers, chills, muscle aches and body aches. On December 9 she started having severe headaches, photosensitivity, phonosensitivity, nausea, vomiting. No focal neurologic symptoms. She does have history of migraines albeit not as severe. Though this could be a COVID symptom, they do sound awfully like a classic migraine, so we will add Decadron, Toradol oral, Phenergan. She does  have a history of gastritis/PUD, so we will add Nexium 40 mg p.o. twice daily. She should follow-up with [place] within a week or 2.  I discussed the above assessment and treatment plan with the patient. The patient was provided an opportunity to ask questions and all were answered. The patient agreed with the plan and demonstrated an understanding of the instructions.   The patient was advised to call back or seek an in-person evaluation if the symptoms worsen or if the condition fails to improve as anticipated.   I provided 30 minutes of verbal and non-verbal time during this encounter date, time was needed to gather information, review chart, records, communicate/coordinate with staff remotely, as well as complete documentation.   ___________________________________________ Ihor Austin. Benjamin Stain, M.D., ABFM., CAQSM. Primary Care and Sports Medicine [facility]  Adjunct Professor of Family Medicine  [employer]

## 2021-05-19 NOTE — Progress Notes (Signed)
Patient reports having COVID on Dec 3rd. On Dec 9th she started having migraine headaches everyday. They start with her feeling hot and dizzy, light sensitivity, nausea, has vomited once

## 2021-05-19 NOTE — Assessment & Plan Note (Signed)
This is a pleasant 55 year old female with a history of complex partial seizures, she was diagnosed with COVID on December 3, symptomatology was predominately fevers, chills, muscle aches and body aches. On December 9 she started having severe headaches, photosensitivity, phonosensitivity, nausea, vomiting. No focal neurologic symptoms. She does have history of migraines albeit not as severe. Though this could be a COVID symptom, they do sound awfully like a classic migraine, so we will add Decadron, Toradol oral, Phenergan. She does have a history of gastritis/PUD, so we will add Nexium 40 mg p.o. twice daily. She should follow-up with [place] within a week or 2.

## 2021-05-19 NOTE — Telephone Encounter (Signed)
Spoke with patient, she stated that she would have her husband get it from [employer] for $12.

## 2021-06-08 DIAGNOSIS — G35 Multiple sclerosis: Secondary | ICD-10-CM | POA: Diagnosis not present

## 2021-06-08 DIAGNOSIS — R202 Paresthesia of skin: Secondary | ICD-10-CM | POA: Diagnosis not present

## 2021-06-08 DIAGNOSIS — R5383 Other fatigue: Secondary | ICD-10-CM | POA: Diagnosis not present

## 2021-06-08 DIAGNOSIS — G40109 Localization-related (focal) (partial) symptomatic epilepsy and epileptic syndromes with simple partial seizures, not intractable, without status epilepticus: Secondary | ICD-10-CM | POA: Diagnosis not present

## 2021-08-27 ENCOUNTER — Encounter: Payer: Self-pay | Admitting: Physician Assistant

## 2021-08-27 DIAGNOSIS — N6321 Unspecified lump in the left breast, upper outer quadrant: Secondary | ICD-10-CM

## 2021-08-31 ENCOUNTER — Other Ambulatory Visit: Payer: Self-pay | Admitting: Physician Assistant

## 2021-08-31 DIAGNOSIS — N6311 Unspecified lump in the right breast, upper outer quadrant: Secondary | ICD-10-CM

## 2021-09-27 ENCOUNTER — Ambulatory Visit
Admission: RE | Admit: 2021-09-27 | Discharge: 2021-09-27 | Disposition: A | Discharge status: Home / Self Care | Payer: BC Managed Care – PPO | Source: Ambulatory Visit | Attending: Physician Assistant | Admitting: Physician Assistant

## 2021-09-27 ENCOUNTER — Other Ambulatory Visit: Payer: Self-pay | Admitting: Physician Assistant

## 2021-09-27 DIAGNOSIS — N6311 Unspecified lump in the right breast, upper outer quadrant: Secondary | ICD-10-CM

## 2021-09-27 DIAGNOSIS — R922 Inconclusive mammogram: Secondary | ICD-10-CM | POA: Diagnosis not present

## 2021-09-27 DIAGNOSIS — N6011 Diffuse cystic mastopathy of right breast: Secondary | ICD-10-CM | POA: Diagnosis not present

## 2021-09-28 ENCOUNTER — Encounter: Payer: Self-pay | Admitting: Physician Assistant

## 2021-09-28 DIAGNOSIS — N6001 Solitary cyst of right breast: Secondary | ICD-10-CM | POA: Insufficient documentation

## 2021-09-28 NOTE — Progress Notes (Signed)
Benign right breast cyst. GREAT news. Screening mammogram in October.

## 2021-12-16 DIAGNOSIS — L570 Actinic keratosis: Secondary | ICD-10-CM | POA: Diagnosis not present

## 2021-12-16 DIAGNOSIS — L820 Inflamed seborrheic keratosis: Secondary | ICD-10-CM | POA: Diagnosis not present

## 2021-12-16 DIAGNOSIS — L0100 Impetigo, unspecified: Secondary | ICD-10-CM | POA: Diagnosis not present

## 2021-12-17 ENCOUNTER — Ambulatory Visit: Payer: BC Managed Care – PPO | Admitting: Physician Assistant

## 2021-12-17 VITALS — BP 127/72 | HR 70 | Ht 62.0 in | Wt 114.0 lb

## 2021-12-17 DIAGNOSIS — E010 Iodine-deficiency related diffuse (endemic) goiter: Secondary | ICD-10-CM

## 2021-12-17 DIAGNOSIS — E559 Vitamin D deficiency, unspecified: Secondary | ICD-10-CM | POA: Diagnosis not present

## 2021-12-17 DIAGNOSIS — Z79899 Other long term (current) drug therapy: Secondary | ICD-10-CM

## 2021-12-17 DIAGNOSIS — Z1159 Encounter for screening for other viral diseases: Secondary | ICD-10-CM

## 2021-12-17 DIAGNOSIS — Z13220 Encounter for screening for lipoid disorders: Secondary | ICD-10-CM | POA: Diagnosis not present

## 2021-12-17 DIAGNOSIS — G379 Demyelinating disease of central nervous system, unspecified: Secondary | ICD-10-CM | POA: Diagnosis not present

## 2021-12-17 DIAGNOSIS — Z131 Encounter for screening for diabetes mellitus: Secondary | ICD-10-CM | POA: Diagnosis not present

## 2021-12-17 DIAGNOSIS — I447 Left bundle-branch block, unspecified: Secondary | ICD-10-CM

## 2021-12-17 DIAGNOSIS — G40309 Generalized idiopathic epilepsy and epileptic syndromes, not intractable, without status epilepticus: Secondary | ICD-10-CM

## 2021-12-17 NOTE — Progress Notes (Signed)
   Acute Office Visit  Subjective:     Patient ID: Destiny King, female    DOB: 1966-04-28, 56 y.o.   MRN: [id number]  Chief Complaint  Patient presents with   Skin Problem    HPI Patient is in today for yearly labs.   Pt is doing well. She continues to occasionally have CP but has been worked up by cardiology. She has some left leg neuropathy as well. She is taking her medications. She is exercising regularly and feels good.   Review of Systems  All other systems reviewed and are negative.       Objective:    LMP 12/18/2010  BP Readings from Last 3 Encounters:  12/17/21 139/68  08/14/20 140/70  08/05/20 116/65   Wt Readings from Last 3 Encounters:  12/17/21 114 lb (51.7 kg)  08/14/20 122 lb (55.3 kg)  07/27/20 118 lb 1.3 oz (53.6 kg)      Physical Exam Constitutional:      Appearance: Normal appearance.  HENT:     Head: Normocephalic.  Cardiovascular:     Rate and Rhythm: Normal rate and regular rhythm.     Pulses: Normal pulses.     Heart sounds: Normal heart sounds.  Pulmonary:     Effort: Pulmonary effort is normal.     Breath sounds: Normal breath sounds.  Musculoskeletal:     Cervical back: Normal range of motion and neck supple.  Neurological:     General: No focal deficit present.     Mental Status: She is alert and oriented to person, place, and time.  Psychiatric:        Mood and Affect: Mood normal.          Assessment & Plan:  .[name] was seen today for skin problem.  Diagnoses and all orders for this visit:  CNS demyelination (HCC) -     B12 and Folate Panel  Screening for diabetes mellitus -     COMPLETE METABOLIC PANEL WITH GFR  Screening for lipid disorders -     Lipid Panel w/reflex Direct LDL  Thyromegaly -     TSH  Medication management -     TSH -     Lipid Panel w/reflex Direct LDL -     COMPLETE METABOLIC PANEL WITH GFR -     CBC with Differential/Platelet  Generalized convulsive epilepsy (HCC)  Vitamin D  insufficiency -     VITAMIN D 25 Hydroxy (Vit-D Deficiency, Fractures)  Encounter for hepatitis C screening test for low risk patient -     Hepatitis C Antibody  Left bundle branch block (LBBB)   Screening labs ordered today.  Discussed vitamin D and calcium.  2nd recheck of BP looked great.   Tandy Gaw, PA-C

## 2021-12-20 ENCOUNTER — Encounter: Payer: Self-pay | Admitting: Physician Assistant

## 2021-12-20 LAB — COMPLETE METABOLIC PANEL WITH GFR
AG Ratio: 2 (calc) (ref 1.0–2.5)
ALT: 13 U/L (ref 6–29)
AST: 18 U/L (ref 10–35)
Albumin: 4.4 g/dL (ref 3.6–5.1)
Alkaline phosphatase (APISO): 61 U/L (ref 37–153)
BUN: 14 mg/dL (ref 7–25)
CO2: 29 mmol/L (ref 20–32)
Calcium: 9.2 mg/dL (ref 8.6–10.4)
Chloride: 102 mmol/L (ref 98–110)
Creat: 0.85 mg/dL (ref 0.50–1.03)
Globulin: 2.2 g/dL (calc) (ref 1.9–3.7)
Glucose, Bld: 94 mg/dL (ref 65–99)
Potassium: 4.5 mmol/L (ref 3.5–5.3)
Sodium: 139 mmol/L (ref 135–146)
Total Bilirubin: 0.5 mg/dL (ref 0.2–1.2)
Total Protein: 6.6 g/dL (ref 6.1–8.1)
eGFR: 81 mL/min/{1.73_m2} (ref >=60)

## 2021-12-20 LAB — LIPID PANEL W/REFLEX DIRECT LDL
Cholesterol: 177 mg/dL (ref <200)
HDL: 78 mg/dL (ref >=50)
LDL Cholesterol (Calc): 86 mg/dL (calc)
Non-HDL Cholesterol (Calc): 99 mg/dL (calc) (ref <130)
Total CHOL/HDL Ratio: 2.3 (calc) (ref <5.0)
Triglycerides: 46 mg/dL (ref <150)

## 2021-12-20 LAB — CBC WITH DIFFERENTIAL/PLATELET
Absolute Monocytes: 282 cells/uL (ref 200–950)
Basophils Absolute: 31 cells/uL (ref 0–200)
Basophils Relative: 0.9 %
Eosinophils Absolute: 61 cells/uL (ref 15–500)
Eosinophils Relative: 1.8 %
HCT: 40.2 % (ref 35.0–45.0)
Hemoglobin: 13.3 g/dL (ref 11.7–15.5)
Lymphs Abs: 1156 cells/uL (ref 850–3900)
MCH: 32.6 pg (ref 27.0–33.0)
MCHC: 33.1 g/dL (ref 32.0–36.0)
MCV: 98.5 fL (ref 80.0–100.0)
MPV: 10.2 fL (ref 7.5–12.5)
Monocytes Relative: 8.3 %
Neutro Abs: 1870 cells/uL (ref 1500–7800)
Neutrophils Relative %: 55 %
Platelets: 192 10*3/uL (ref 140–400)
RBC: 4.08 10*6/uL (ref 3.80–5.10)
RDW: 11.3 % (ref 11.0–15.0)
Total Lymphocyte: 34 %
WBC: 3.4 10*3/uL — ABNORMAL LOW (ref 3.8–10.8)

## 2021-12-20 LAB — TSH: TSH: 2.05 mIU/L

## 2021-12-20 LAB — VITAMIN D 25 HYDROXY (VIT D DEFICIENCY, FRACTURES): Vit D, 25-Hydroxy: 34 ng/mL (ref 30–100)

## 2021-12-20 LAB — HEPATITIS C ANTIBODY: Hepatitis C Ab: NONREACTIVE

## 2021-12-20 LAB — B12 AND FOLATE PANEL
Folate: 18.7 ng/mL
Vitamin B-12: 391 pg/mL (ref 200–1100)

## 2021-12-20 NOTE — Progress Notes (Signed)
Destiny King,   Cholesterol looks amazing.  Thyroid looks great.  Kidney, liver, glucose looks good.  Vitamin D low normal. Start 2000 units daily.  B12 on low side of normal. Start b12 daily.

## 2021-12-20 NOTE — Progress Notes (Signed)
Hep C negative.

## 2022-02-08 DIAGNOSIS — G35 Multiple sclerosis: Secondary | ICD-10-CM | POA: Diagnosis not present

## 2022-02-10 ENCOUNTER — Encounter: Payer: Self-pay | Admitting: Physician Assistant

## 2022-02-11 DIAGNOSIS — H524 Presbyopia: Secondary | ICD-10-CM | POA: Diagnosis not present

## 2022-02-11 DIAGNOSIS — H5213 Myopia, bilateral: Secondary | ICD-10-CM | POA: Diagnosis not present

## 2022-02-11 DIAGNOSIS — H52223 Regular astigmatism, bilateral: Secondary | ICD-10-CM | POA: Diagnosis not present

## 2022-02-22 ENCOUNTER — Ambulatory Visit: Payer: BC Managed Care – PPO | Admitting: Physician Assistant

## 2022-02-22 ENCOUNTER — Encounter: Payer: Self-pay | Admitting: Physician Assistant

## 2022-02-22 VITALS — BP 140/68 | HR 67 | Ht 62.0 in | Wt 114.0 lb

## 2022-02-22 DIAGNOSIS — R0609 Other forms of dyspnea: Secondary | ICD-10-CM

## 2022-02-22 DIAGNOSIS — R0789 Other chest pain: Secondary | ICD-10-CM | POA: Diagnosis not present

## 2022-02-22 DIAGNOSIS — R6889 Other general symptoms and signs: Secondary | ICD-10-CM

## 2022-02-22 MED ORDER — ALBUTEROL SULFATE (2.5 MG/3ML) 0.083% IN NEBU
2.5000 mg | INHALATION_SOLUTION | Freq: Once | RESPIRATORY_TRACT | Status: AC
  Administered 2022-02-22: 2.5 mg via RESPIRATORY_TRACT

## 2022-02-22 NOTE — Progress Notes (Unsigned)
Acute Office Visit  Subjective:     Patient ID: Destiny King, female    DOB: [birth year], 56 y.o.   MRN: [id number]  Chief Complaint  Patient presents with   Shortness of Breath    Chest tightness    HPI Patient is in today for episode of SOB and chest pain that has left her really concerned.   She was exercising last week with walking and suddenly she felt very short of breath and like she was going to pass out. She had some central sharp shooting chest pain and had to stop. Seemed to last about 64minutes and then symptoms resolved. She is used to exercise by walking and at times walks 15miles a day. She did not have any cough or wheezing. She denies any swelling. She has had a few more mild episodes of this but nothing like the first. It was really scary and she felt like she was going to pass out.   She has known LBBB and seen cardiology in the past for abnormal stress test. She had no blockages and did not need follow up.   .. Active Ambulatory Problems    Diagnosis Date Noted   COMMON MIGRAINE 08/29/2007   Allergic rhinitis 01/30/2009   NUMBNESS, HAND 08/29/2007   ANEMIA, IRON DEFICIENCY, HX OF 08/29/2007   ANXIETY 07/02/2010   Generalized convulsive epilepsy ([place]) 02/14/2011   Absence seizure ([place]) 02/14/2011   Rectal bleeding 01/12/2014   Change in stool 01/12/2014   H. pylori infection 02/04/2014   Diverticulosis 02/04/2014   Peptic ulcer disease 07/01/2014   Dense breast tissue 08/26/2014   Breast cancer screening 08/26/2014   Allergic rhinitis due to pollen 03/06/2015   Thyromegaly 03/06/2015   Other fatigue 03/06/2015   Difficulty swallowing 03/06/2015   Seasonal allergies 03/06/2015   Menopausal symptoms 10/12/2015   Abnormal mammogram of left breast 10/23/2015   Hematuria 10/27/2015   Globus sensation 01/29/2016   Chronic cystitis 01/29/2016   Right arm weakness 04/04/2016   Right leg weakness 04/04/2016   Left eye pain 05/18/2016   Lower extremity  weakness 06/20/2017   Seizures ([place]) 06/20/2017   Crepitus of both knee joints 06/20/2017   Breast calcification, right 01/02/2018   CNS demyelination ([place]) 12/02/2016   White matter abnormality on MRI of brain 07/27/2016   Chest pain 07/13/2020   Pain of left thumb 07/13/2020   Palpitations 07/13/2020   Anxiety    Complication of anesthesia    GERD (gastroesophageal reflux disease)    Chronic low back pain 11/14/2016   COVID 03/30/2020   Numbness and tingling of both legs 07/27/2016   Tingling 07/27/2016   Neck pain 07/27/2016   Angina pectoris ([place]) 07/27/2020   Dyspnea on exertion 07/27/2020   Abnormal stress test 07/27/2020   Frequent headaches 05/19/2021   Benign breast cyst in female, right 09/28/2021   Left bundle branch block (LBBB) 12/17/2021   Resolved Ambulatory Problems    Diagnosis Date Noted   UTI (lower urinary tract infection) 12/29/2010   No Additional Past Medical History      ROS See HPI.      Objective:    BP (!) 140/68 Comment: right arm  Pulse 67   Ht 5\' 2"  (1.575 m)   Wt 114 lb (51.7 kg)   LMP 12/18/2010   SpO2 98%   BMI 20.85 kg/m  BP Readings from Last 3 Encounters:  02/22/22 (!) 140/68  12/17/21 127/72  08/14/20 140/70   Wt Readings from Last  3 Encounters:  02/22/22 114 lb (51.7 kg)  12/17/21 114 lb (51.7 kg)  08/14/20 122 lb (55.3 kg)    Peak flow reading is 250.      Physical Exam Constitutional:      Appearance: She is well-developed.  HENT:     Head: Normocephalic.  Cardiovascular:     Rate and Rhythm: Normal rate and regular rhythm.     Pulses: Normal pulses.     Heart sounds: Murmur heard.  Pulmonary:     Effort: Pulmonary effort is normal.     Breath sounds: Normal breath sounds.  Chest:     Chest wall: No mass, tenderness or edema.  Musculoskeletal:     Right lower leg: No edema.     Left lower leg: No edema.  Neurological:     General: No focal deficit present.     Mental Status: She is alert.   Psychiatric:        Mood and Affect: Mood normal.          Assessment & Plan:  .[name] was seen today for shortness of breath.  Diagnoses and all orders for this visit:  Dyspnea on exertion -     albuterol (PROVENTIL) (2.5 MG/3ML) 0.083% nebulizer solution 2.5 mg  Atypical chest pain  Unclear etiology of these episodes It is concerning for more of a panic attack like situation but patient does not feel like this is anxiety or panic attack related at all.  She has had coronary artery scans that were negative for blockages but not had echo or heart monitor Will order both of these Peak flow was not to goal at all but also had little to no improvement after albuterol nebulizer treatment Lungs sound great today  Follow up after cardiac testing   Tandy Gaw, PA-C

## 2022-02-22 NOTE — Patient Instructions (Signed)
Echo and heart monitor

## 2022-02-23 ENCOUNTER — Other Ambulatory Visit: Payer: Self-pay | Admitting: Physician Assistant

## 2022-02-23 ENCOUNTER — Ambulatory Visit: Payer: BC Managed Care – PPO | Attending: Physician Assistant

## 2022-02-23 DIAGNOSIS — R000 Tachycardia, unspecified: Secondary | ICD-10-CM

## 2022-02-23 DIAGNOSIS — R0789 Other chest pain: Secondary | ICD-10-CM

## 2022-02-23 DIAGNOSIS — R6889 Other general symptoms and signs: Secondary | ICD-10-CM

## 2022-02-23 DIAGNOSIS — R0609 Other forms of dyspnea: Secondary | ICD-10-CM

## 2022-02-23 NOTE — Progress Notes (Unsigned)
Enrolled for [employer] to mail a ZIO XT long term holter monitor to the patients address on file.   DOD to read. 

## 2022-02-26 DIAGNOSIS — R0609 Other forms of dyspnea: Secondary | ICD-10-CM

## 2022-02-26 DIAGNOSIS — R6889 Other general symptoms and signs: Secondary | ICD-10-CM | POA: Diagnosis not present

## 2022-02-26 DIAGNOSIS — R0789 Other chest pain: Secondary | ICD-10-CM

## 2022-02-26 DIAGNOSIS — R000 Tachycardia, unspecified: Secondary | ICD-10-CM

## 2022-03-07 ENCOUNTER — Ambulatory Visit (HOSPITAL_BASED_OUTPATIENT_CLINIC_OR_DEPARTMENT_OTHER)
Admission: RE | Admit: 2022-03-07 | Discharge: 2022-03-07 | Disposition: A | Discharge status: Home / Self Care | Payer: BC Managed Care – PPO | Source: Ambulatory Visit | Attending: Physician Assistant | Admitting: Physician Assistant

## 2022-03-07 DIAGNOSIS — R0609 Other forms of dyspnea: Secondary | ICD-10-CM | POA: Diagnosis not present

## 2022-03-07 DIAGNOSIS — R6889 Other general symptoms and signs: Secondary | ICD-10-CM | POA: Diagnosis not present

## 2022-03-07 DIAGNOSIS — R0789 Other chest pain: Secondary | ICD-10-CM | POA: Diagnosis not present

## 2022-03-07 LAB — ECHOCARDIOGRAM COMPLETE
AR max vel: 3.34 cm2
AV Area VTI: 2.98 cm2
AV Area mean vel: 2.71 cm2
AV Mean grad: 2 mmHg
AV Peak grad: 4.8 mmHg
Ao pk vel: 1.09 m/s
Area-P 1/2: 4.08 cm2
S' Lateral: 2.9 cm

## 2022-03-07 NOTE — Progress Notes (Signed)
  Echocardiogram 2D Echocardiogram has been performed.  Destiny King F 03/07/2022, 11:52 AM

## 2022-03-07 NOTE — Progress Notes (Signed)
Echo looks good.  EF is normal at 60-65 percent.  No aortic or mitral regurgitation.

## 2022-03-08 ENCOUNTER — Other Ambulatory Visit: Payer: Self-pay | Admitting: Neurology

## 2022-03-08 DIAGNOSIS — R519 Headache, unspecified: Secondary | ICD-10-CM

## 2022-03-08 MED ORDER — ESOMEPRAZOLE MAGNESIUM 40 MG PO CPDR: DELAYED_RELEASE_CAPSULE | ORAL | 0 refills | Status: DC

## 2022-03-10 DIAGNOSIS — L570 Actinic keratosis: Secondary | ICD-10-CM | POA: Diagnosis not present

## 2022-03-10 DIAGNOSIS — D485 Neoplasm of uncertain behavior of skin: Secondary | ICD-10-CM | POA: Diagnosis not present

## 2022-03-10 DIAGNOSIS — D225 Melanocytic nevi of trunk: Secondary | ICD-10-CM | POA: Diagnosis not present

## 2022-03-10 DIAGNOSIS — D2239 Melanocytic nevi of other parts of face: Secondary | ICD-10-CM | POA: Diagnosis not present

## 2022-03-10 DIAGNOSIS — L814 Other melanin hyperpigmentation: Secondary | ICD-10-CM | POA: Diagnosis not present

## 2022-03-11 DIAGNOSIS — R0609 Other forms of dyspnea: Secondary | ICD-10-CM | POA: Diagnosis not present

## 2022-03-11 DIAGNOSIS — R000 Tachycardia, unspecified: Secondary | ICD-10-CM | POA: Diagnosis not present

## 2022-03-15 NOTE — Progress Notes (Signed)
Overall monitor looks pretty good. Rare ectopy. 1 episode of nonsustained ventricular tachycardia. I don't believe any intervention cardiac is warranted. Do you have appt with second opinion cardiologist.

## 2022-03-16 ENCOUNTER — Encounter: Payer: Self-pay | Admitting: Physician Assistant

## 2022-03-16 NOTE — Telephone Encounter (Signed)
Looks like this is more the middle of a conversation between the 2 of them.  It sounds like at some point a second opinion was discussed but patient does not seem to be interested I guess?  Not sure, very confused.  Were more than happy to place referral for second opinion if she would like.  Otherwise she can wait until Luvenia Starch is back in the office.

## 2022-03-22 ENCOUNTER — Other Ambulatory Visit: Payer: Self-pay | Admitting: Physician Assistant

## 2022-03-22 DIAGNOSIS — R0789 Other chest pain: Secondary | ICD-10-CM

## 2022-03-22 DIAGNOSIS — I447 Left bundle-branch block, unspecified: Secondary | ICD-10-CM

## 2022-03-22 DIAGNOSIS — R6889 Other general symptoms and signs: Secondary | ICD-10-CM

## 2022-03-22 DIAGNOSIS — R0609 Other forms of dyspnea: Secondary | ICD-10-CM

## 2022-03-22 DIAGNOSIS — R002 Palpitations: Secondary | ICD-10-CM

## 2022-03-22 NOTE — Progress Notes (Signed)
New referral placed.

## 2022-03-28 DIAGNOSIS — C44519 Basal cell carcinoma of skin of other part of trunk: Secondary | ICD-10-CM | POA: Diagnosis not present

## 2022-04-25 ENCOUNTER — Ambulatory Visit: Payer: BC Managed Care – PPO | Admitting: Cardiology

## 2022-05-23 ENCOUNTER — Ambulatory Visit: Payer: BC Managed Care – PPO | Attending: Cardiology | Admitting: Cardiology

## 2022-05-23 ENCOUNTER — Encounter: Payer: Self-pay | Admitting: Cardiology

## 2022-05-23 VITALS — BP 122/74 | HR 76 | Ht 62.0 in | Wt 111.0 lb

## 2022-05-23 DIAGNOSIS — R079 Chest pain, unspecified: Secondary | ICD-10-CM

## 2022-05-23 NOTE — Progress Notes (Signed)
Electrophysiology Office Note   Date:  05/23/2022   ID:  Kaneshia Fornshell, DOB [birth year], MRN [id number]  PCP:  Donella Stade, PA-C  Cardiologist:   Primary Electrophysiologist:  [place] Meredith Leeds, MD    Chief Complaint: Chest pain   History of Present Illness: Destiny King is a 56 y.o. female who is being seen today for the evaluation of chest pain at the request of Donella Stade, PA-C. Presenting today for electrophysiology evaluation.  She gets chest pain with exertion.  She states that she feels a strong pounding in her chest that at times goes up to her neck.  2 years ago, she had the same discomfort.  She had an exercise treadmill test that showed a rate related left bundle branch block.  Subsequently, she had a coronary CT that showed no evidence of coronary artery disease and a calcium score of 0.  She has most recently had a normal echo and a cardiac monitor without major abnormality.  She continues to have these episodes as above.  She has had 1 at rest.  She has tried to catch all of these episodes on her Apple Watch, but has not found any major abnormality.  Today, she denies symptoms of palpitations, chest pain, shortness of breath, orthopnea, PND, lower extremity edema, claudication, dizziness, presyncope, syncope, bleeding, or neurologic sequela. The patient is tolerating medications without difficulties.    Past Medical History:  Diagnosis Date   Abnormal mammogram of left breast 10/23/2015   Pathology revealed pseudoangiomatous stromal hyperplasia and fibrocystic changes in the left breast. 10/2015.   Formatting of this note might be different from the original. Overview:  Pathology revealed pseudoangiomatous stromal hyperplasia and fibrocystic changes in the left breast. 10/2015.   Absence seizure ([place]) 02/14/2011   Dr. [facility] in [place].   Formatting of this note might be different from the original. Overview:  Dr. Debbora Presto  [facility] in [place].   Allergic rhinitis 01/30/2009   Qualifier: Diagnosis of  By: Madilyn Fireman MD, Clare Charon of this note might be different from the original. Overview:  Qualifier: Diagnosis of  By: Madilyn Fireman MD, Catherine   Allergic rhinitis due to pollen 03/06/2015   ANEMIA, IRON DEFICIENCY, HX OF 08/29/2007   Qualifier: Diagnosis of  By: Madilyn Fireman MD, Catherine     Anxiety    ANXIETY 07/02/2010   Qualifier: Diagnosis of  By: Madilyn Fireman MD, Catherine     Breast calcification, right 01/02/2018   Diagnostic mammogram done 01/2018 suggested 6 month follow up.  F/U 07/2018 suggested follow up in 6 months.   Formatting of this note might be different from the original. Diagnostic mammogram done 01/2018 suggested 6 month follow up.  F/U 07/2018 suggested follow up in 6 months.   Breast cancer screening 08/26/2014   Change in stool 01/12/2014   Chest pain 07/13/2020   Chronic cystitis 01/29/2016   Chronic low back pain 11/14/2016   Formatting of this note is different from the original. ---June 2018-MRI---Mild multilevel degenerative changes of the lower lumbar spine with mild bilateral foraminal stenosis at L4-L5 with facet hypertrophy abutting the exiting left L4 nerve roots.   Formatting of this note is different from the original. Formatting of this note might be different from the original. ---June 2018-MRI---Mild multi   CNS demyelination ([place]) 12/02/2016   COMMON MIGRAINE 08/29/2007   Qualifier: Diagnosis of  By: Madilyn Fireman MD, Catherine     Complication of anesthesia    states she is  very sensitive to "sedating" drugs   COVID 03/30/2020   Crepitus of both knee joints 06/20/2017   Dense breast tissue 08/26/2014   Difficulty swallowing 03/06/2015   Generalized convulsive epilepsy ([place]) 02/14/2011   Se[facility] Dr. Jacques Navy at [facility].   Formatting of this note might be different from the original. Overview:  Sees Dr. Jacques Navy at [facility].   GERD (gastroesophageal reflux disease)    Globus sensation 01/29/2016   H. pylori infection 02/04/2014   Confirmed with EGD 01/30/14  Formatting of this note might be different from the original. Overview:  Confirmed with EGD 01/30/14   Hematuria 10/27/2015   Cystoscopy normal. Work up normal urology.     Left eye pain 05/18/2016   Seem by Dr. Annye Rusk, pending normal MRI suggested treatment for trigeminal neuralgia.    Lower extremity weakness 06/20/2017   Menopausal symptoms 10/12/2015   Neck pain 07/27/2016   Formatting of this note might be different from the original. --Dec 2017-MRI---No acute abnormality. Scattered T2 white matter lesions likely result of prior trauma or migraine, less likely vasculitis or demyelinating process. No abnormal enhancement.     --Dec 2017-EEG--- generalized spike and slow wave discharges consistent with petit mal epilepsy ---Dec 2017-EMG / PNCV--- negative for cervical    Numbness and tingling of both legs 07/27/2016   Formatting of this note is different from the original. ---June 2018-MRI---Mild multilevel degenerative changes of the lower lumbar spine with mild bilateral foraminal stenosis at L4-L5 with facet hypertrophy abutting the exiting left L4 nerve roots.    --Dec 2017-MRI---No acute abnormality. Scattered T2 white matter lesions likely result of prior trauma or migraine, less likely vasculitis or demy   NUMBNESS, HAND 08/29/2007   Annotation: Left Qualifier: Diagnosis of  By: Madilyn Fireman MD, Catherine     Other fatigue 03/06/2015   Pain of left thumb 07/13/2020   Palpitations 07/13/2020   Peptic ulcer disease 07/01/2014   Avoid NSAIDs.  Benign colonic mucosa- 01/30/14 Colonoscopy every 5 years.   Formatting of this note might be different from the original. Overview:  Avoid NSAIDs.  Benign colonic mucosa- 01/30/14 Colonoscopy every 5 years.   Rectal bleeding 01/12/2014   Right arm weakness 04/04/2016   EMG cannot confirm peripheral neuropathy. 05/2016.    Right  leg weakness 04/04/2016   EMG cannot confirm lumbar radicuolopathy of either leg. 05/2016.   Formatting of this note might be different from the original. Overview:  EMG cannot confirm lumbar radicuolopathy of either leg. 05/2016.   Seasonal allergies 03/06/2015   Seizures ([place])    as young child   Thyromegaly 03/06/2015   Tingling 07/27/2016   White matter abnormality on MRI of brain 07/27/2016   Past Surgical History:  Procedure Laterality Date   ABDOMINAL HYSTERECTOMY     partial   BREAST BIOPSY Left    needle core biopsy   BREAST EXCISIONAL BIOPSY Left 11/13/2015   BREAST LUMPECTOMY WITH RADIOACTIVE SEED LOCALIZATION Left 11/13/2015   Procedure: BREAST LUMPECTOMY WITH LEFT RADIOACTIVE SEED LOCALIZATION;  Surgeon: Excell Seltzer, MD;  Location: [facility];  Service: General;  Laterality: Left;   CHOLECYSTECTOMY     TUBAL LIGATION     tubal reversal  2005     Current Outpatient Medications  Medication Sig Dispense Refill   Evening Primrose Oil 500 MG CAPS Take 500 mg by mouth daily.     lamoTRIgine (LAMICTAL) 200 MG tablet Take 200 mg by mouth  2 (two) times daily.     levETIRAcetam (KEPPRA) 250 MG tablet Take 250 mg by mouth 2 (two) times daily.     Magnesium 500 MG CAPS Take 500 mg by mouth daily.     promethazine (PHENERGAN) 25 MG tablet Take 1 tablet (25 mg total) by mouth every 6 (six) hours as needed for nausea. 30 tablet 3   No current facility-administered medications for this visit.    Allergies:   Cefdinir, Cetirizine, Ciprofloxacin, Meperidine, Meperidine hcl, Morphine, Iodinated contrast media, Topiramate, Meperidine hcl, and Morphine and related   Social History:  The patient  reports that she has never smoked. She has never used smokeless tobacco. She reports that she does not drink alcohol and does not use drugs.   Family History:  The patient's family history includes Cancer in her father and mother; Hypertension in her sister.    ROS:  Please  see the history of present illness.   Otherwise, review of systems is positive for none.   All other systems are reviewed and negative.    PHYSICAL EXAM: VS:  BP 122/74   Pulse 76   Ht 5\' 2"  (1.575 m)   Wt 111 lb (50.3 kg)   LMP 12/18/2010   SpO2 98%   BMI 20.30 kg/m  , BMI Body mass index is 20.3 kg/m. GEN: Well nourished, well developed, in no acute distress  HEENT: normal  Neck: no JVD, carotid bruits, or masses Cardiac: RRR; no murmurs, rubs, or gallops,no edema  Respiratory:  clear to auscultation bilaterally, normal work of breathing GI: soft, nontender, nondistended, + BS MS: no deformity or atrophy  Skin: warm and dry Neuro:  Strength and sensation are intact Psych: euthymic mood, full affect  EKG:  EKG is ordered today. Personal review of the ekg ordered shows sinus rhythm rate 74   Recent Labs: 12/17/2021: ALT 13; BUN 14; Creat 0.85; Hemoglobin 13.3; Platelets 192; Potassium 4.5; Sodium 139; TSH 2.05    Lipid Panel     Component Value Date/Time   CHOL 177 12/17/2021 0936   TRIG 46 12/17/2021 0936   HDL 78 12/17/2021 0936   CHOLHDL 2.3 12/17/2021 0936   VLDL 12 02/24/2016 1040   LDLCALC 86 12/17/2021 0936     Wt Readings from Last 3 Encounters:  05/23/22 111 lb (50.3 kg)  02/22/22 114 lb (51.7 kg)  12/17/21 114 lb (51.7 kg)      Other studies Reviewed: Additional studies/ records that were reviewed today include: TTE 03/07/22  Review of the above records today demonstrates:   1. GLS -20.1. Left ventricular ejection fraction, by estimation, is 60 to  65%. The left ventricle has normal function. The left ventricle has no  regional wall motion abnormalities. Left ventricular diastolic parameters  were normal.   2. Right ventricular systolic function is normal. The right ventricular  size is normal.   3. The mitral valve is normal in structure. No evidence of mitral valve  regurgitation. No evidence of mitral stenosis.   4. The aortic valve is normal  in structure. Aortic valve regurgitation is  not visualized. No aortic stenosis is present.   5. The inferior vena cava is normal in size with greater than 50%  respiratory variability, suggesting right atrial pressure of 3 mmHg.   Monitor 03/14/2022 personally reviewed HR 47 - 139bpm, average 64 bpm. 1 NSVT lasting 4 beats. Rare supraventricular and ventricular ectopy. No sustained arrhythmias. No atrial fibrillation.  Coronary CTA 08/10/2020 1. Calcium Score 0  2.  Normal aortic root 2.9 cm   3.  CAD RADS 0 normal right dominant coronary arteries  ASSESSMENT AND PLAN:  1.  Chest pain with exertion: Fortunately she has had a coronary CTA without evidence of coronary artery disease.  She wore a cardiac monitor that showed no major abnormalities and has had an echo with a normal ejection fraction.  She does have left bundle branch block that occurs when she exercises.  It is possible that she feels the dyssynchrony from her left bundle branch block causing her to have symptoms.  I have reassured her that she has not in danger of outcomes with the above studies being negative.  She Korin Hartwell continue to exercise and rest as needed.    Current medicines are reviewed at length with the patient today.   The patient does not have concerns regarding her medicines.  The following changes were made today:  none  Labs/ tests ordered today include:  Orders Placed This Encounter  Procedures   EKG 12-Lead     Disposition:   FU with [place] as needed  Signed, Arbadella Kimbler Meredith Leeds, MD  05/23/2022 3:36 PM     [facility] [address] [place] [address] [phone] (office) [phone] (fax)

## 2022-05-23 NOTE — Patient Instructions (Signed)
Medication Instructions:  Your physician recommends that you continue on your current medications as directed. Please refer to the Current Medication list given to you today.  *If you need a refill on your cardiac medications before your next appointment, please call your pharmacy*   Lab Work: None ordered   Testing/Procedures: None ordered   Follow-Up: At [facility], you and your health needs are our priority.  As part of our continuing mission to provide you with exceptional heart care, we have created designated Provider Care Teams.  These Care Teams include your primary Cardiologist (physician) and Advanced Practice Providers (APPs -  Physician Assistants and Nurse Practitioners) who all work together to provide you with the care you need, when you need it.  Your next appointment:   As needed   The format for your next appointment:   In Person  Provider:   Will Camnitz, MD    Thank you for choosing [facility]!!   Madex Seals, RN [phone]  Other Instructions   Important Information About Sugar           

## 2022-06-07 DIAGNOSIS — R5383 Other fatigue: Secondary | ICD-10-CM | POA: Diagnosis not present

## 2022-06-07 DIAGNOSIS — G35 Multiple sclerosis: Secondary | ICD-10-CM | POA: Diagnosis not present

## 2022-06-07 DIAGNOSIS — G40109 Localization-related (focal) (partial) symptomatic epilepsy and epileptic syndromes with simple partial seizures, not intractable, without status epilepticus: Secondary | ICD-10-CM | POA: Diagnosis not present

## 2022-07-22 DIAGNOSIS — G35 Multiple sclerosis: Secondary | ICD-10-CM | POA: Diagnosis not present

## 2022-09-29 ENCOUNTER — Other Ambulatory Visit: Payer: Self-pay | Admitting: Physician Assistant

## 2022-09-29 DIAGNOSIS — Z1231 Encounter for screening mammogram for malignant neoplasm of breast: Secondary | ICD-10-CM

## 2022-10-05 DIAGNOSIS — Z1330 Encounter for screening examination for mental health and behavioral disorders, unspecified: Secondary | ICD-10-CM | POA: Diagnosis not present

## 2022-10-05 DIAGNOSIS — K594 Anal spasm: Secondary | ICD-10-CM | POA: Diagnosis not present

## 2022-10-05 DIAGNOSIS — R3129 Other microscopic hematuria: Secondary | ICD-10-CM | POA: Diagnosis not present

## 2022-10-05 DIAGNOSIS — Z01419 Encounter for gynecological examination (general) (routine) without abnormal findings: Secondary | ICD-10-CM | POA: Diagnosis not present

## 2022-10-05 DIAGNOSIS — R102 Pelvic and perineal pain: Secondary | ICD-10-CM | POA: Diagnosis not present

## 2022-10-05 DIAGNOSIS — Z1231 Encounter for screening mammogram for malignant neoplasm of breast: Secondary | ICD-10-CM | POA: Diagnosis not present

## 2022-10-11 DIAGNOSIS — Z1231 Encounter for screening mammogram for malignant neoplasm of breast: Secondary | ICD-10-CM | POA: Diagnosis not present

## 2022-11-02 ENCOUNTER — Ambulatory Visit: Payer: BC Managed Care – PPO

## 2022-12-02 DIAGNOSIS — I10 Essential (primary) hypertension: Secondary | ICD-10-CM | POA: Diagnosis not present

## 2022-12-02 DIAGNOSIS — R410 Disorientation, unspecified: Secondary | ICD-10-CM | POA: Diagnosis not present

## 2022-12-02 DIAGNOSIS — R457 State of emotional shock and stress, unspecified: Secondary | ICD-10-CM | POA: Diagnosis not present

## 2022-12-02 DIAGNOSIS — R9431 Abnormal electrocardiogram [ECG] [EKG]: Secondary | ICD-10-CM | POA: Diagnosis not present

## 2022-12-02 DIAGNOSIS — G629 Polyneuropathy, unspecified: Secondary | ICD-10-CM | POA: Diagnosis not present

## 2022-12-02 DIAGNOSIS — G40909 Epilepsy, unspecified, not intractable, without status epilepticus: Secondary | ICD-10-CM | POA: Diagnosis not present

## 2022-12-02 DIAGNOSIS — R531 Weakness: Secondary | ICD-10-CM | POA: Diagnosis not present

## 2022-12-02 DIAGNOSIS — R251 Tremor, unspecified: Secondary | ICD-10-CM | POA: Diagnosis not present

## 2022-12-02 DIAGNOSIS — F419 Anxiety disorder, unspecified: Secondary | ICD-10-CM | POA: Diagnosis not present

## 2022-12-04 DIAGNOSIS — R55 Syncope and collapse: Secondary | ICD-10-CM | POA: Diagnosis not present

## 2022-12-04 DIAGNOSIS — G35 Multiple sclerosis: Secondary | ICD-10-CM | POA: Diagnosis not present

## 2022-12-04 DIAGNOSIS — R29818 Other symptoms and signs involving the nervous system: Secondary | ICD-10-CM | POA: Diagnosis not present

## 2022-12-04 DIAGNOSIS — R569 Unspecified convulsions: Secondary | ICD-10-CM | POA: Diagnosis not present

## 2022-12-04 DIAGNOSIS — R4182 Altered mental status, unspecified: Secondary | ICD-10-CM | POA: Diagnosis not present

## 2022-12-04 DIAGNOSIS — M6281 Muscle weakness (generalized): Secondary | ICD-10-CM | POA: Diagnosis not present

## 2022-12-04 DIAGNOSIS — R531 Weakness: Secondary | ICD-10-CM | POA: Diagnosis not present

## 2022-12-04 DIAGNOSIS — G40A09 Absence epileptic syndrome, not intractable, without status epilepticus: Secondary | ICD-10-CM | POA: Diagnosis not present

## 2022-12-04 DIAGNOSIS — R110 Nausea: Secondary | ICD-10-CM | POA: Diagnosis not present

## 2022-12-05 DIAGNOSIS — R569 Unspecified convulsions: Secondary | ICD-10-CM | POA: Diagnosis not present

## 2022-12-05 DIAGNOSIS — R55 Syncope and collapse: Secondary | ICD-10-CM | POA: Diagnosis not present

## 2022-12-05 DIAGNOSIS — R29818 Other symptoms and signs involving the nervous system: Secondary | ICD-10-CM | POA: Diagnosis not present

## 2022-12-05 DIAGNOSIS — R531 Weakness: Secondary | ICD-10-CM | POA: Diagnosis not present

## 2022-12-06 DIAGNOSIS — R531 Weakness: Secondary | ICD-10-CM | POA: Diagnosis not present

## 2022-12-06 DIAGNOSIS — G35 Multiple sclerosis: Secondary | ICD-10-CM | POA: Diagnosis not present

## 2022-12-06 DIAGNOSIS — G40A09 Absence epileptic syndrome, not intractable, without status epilepticus: Secondary | ICD-10-CM | POA: Diagnosis not present

## 2022-12-06 DIAGNOSIS — R569 Unspecified convulsions: Secondary | ICD-10-CM | POA: Diagnosis not present

## 2022-12-06 DIAGNOSIS — R29818 Other symptoms and signs involving the nervous system: Secondary | ICD-10-CM | POA: Diagnosis not present

## 2022-12-06 DIAGNOSIS — R55 Syncope and collapse: Secondary | ICD-10-CM | POA: Diagnosis not present

## 2022-12-13 DIAGNOSIS — G35 Multiple sclerosis: Secondary | ICD-10-CM | POA: Diagnosis not present

## 2022-12-13 DIAGNOSIS — Z79899 Other long term (current) drug therapy: Secondary | ICD-10-CM | POA: Diagnosis not present

## 2022-12-13 DIAGNOSIS — G609 Hereditary and idiopathic neuropathy, unspecified: Secondary | ICD-10-CM | POA: Diagnosis not present

## 2022-12-13 DIAGNOSIS — M6281 Muscle weakness (generalized): Secondary | ICD-10-CM | POA: Diagnosis not present

## 2022-12-13 DIAGNOSIS — R5383 Other fatigue: Secondary | ICD-10-CM | POA: Diagnosis not present

## 2022-12-13 DIAGNOSIS — E538 Deficiency of other specified B group vitamins: Secondary | ICD-10-CM | POA: Diagnosis not present

## 2022-12-13 DIAGNOSIS — G40109 Localization-related (focal) (partial) symptomatic epilepsy and epileptic syndromes with simple partial seizures, not intractable, without status epilepticus: Secondary | ICD-10-CM | POA: Diagnosis not present

## 2022-12-13 DIAGNOSIS — R259 Unspecified abnormal involuntary movements: Secondary | ICD-10-CM | POA: Diagnosis not present

## 2022-12-27 ENCOUNTER — Ambulatory Visit (INDEPENDENT_AMBULATORY_CARE_PROVIDER_SITE_OTHER): Payer: BC Managed Care – PPO | Admitting: Physician Assistant

## 2022-12-27 ENCOUNTER — Encounter: Payer: Self-pay | Admitting: Physician Assistant

## 2022-12-27 VITALS — BP 144/71 | HR 77 | Ht 62.0 in | Wt 112.5 lb

## 2022-12-27 DIAGNOSIS — R7309 Other abnormal glucose: Secondary | ICD-10-CM | POA: Insufficient documentation

## 2022-12-27 DIAGNOSIS — R030 Elevated blood-pressure reading, without diagnosis of hypertension: Secondary | ICD-10-CM | POA: Diagnosis not present

## 2022-12-27 DIAGNOSIS — G379 Demyelinating disease of central nervous system, unspecified: Secondary | ICD-10-CM | POA: Diagnosis not present

## 2022-12-27 DIAGNOSIS — G40309 Generalized idiopathic epilepsy and epileptic syndromes, not intractable, without status epilepticus: Secondary | ICD-10-CM

## 2022-12-27 DIAGNOSIS — R82998 Other abnormal findings in urine: Secondary | ICD-10-CM

## 2022-12-27 DIAGNOSIS — Z09 Encounter for follow-up examination after completed treatment for conditions other than malignant neoplasm: Secondary | ICD-10-CM | POA: Diagnosis not present

## 2022-12-27 DIAGNOSIS — R319 Hematuria, unspecified: Secondary | ICD-10-CM

## 2022-12-27 DIAGNOSIS — R251 Tremor, unspecified: Secondary | ICD-10-CM

## 2022-12-27 LAB — POCT URINALYSIS DIP (CLINITEK)
Bilirubin, UA: NEGATIVE
Glucose, UA: NEGATIVE mg/dL
Ketones, POC UA: NEGATIVE mg/dL
Leukocytes, UA: NEGATIVE
Nitrite, UA: NEGATIVE
POC PROTEIN,UA: NEGATIVE
Spec Grav, UA: 1.02 (ref 1.010–1.025)
Urobilinogen, UA: 0.2 E.U./dL
pH, UA: 6.5 (ref 5.0–8.0)

## 2022-12-27 NOTE — Patient Instructions (Addendum)
Will go over [place] in 2 weeks Use ativan as needed Keep checking BP at home

## 2022-12-27 NOTE — Progress Notes (Unsigned)
Established Patient Office Visit  Subjective   Patient ID: Destiny King, female    DOB: 1966/03/29  Age: 57 y.o. MRN: [id number]  Chief Complaint  Patient presents with   Hospitalization Follow-up    Pt is here after recent hospital stay. States she is doing better since she has been out of the hospital.    HPI The above patient is here to follow up after ED visit on 12/02/2022  EKG no acute findings  CT head no acute findings  No UTI Normal WBC and electrolytes  BP elevated at 163/81  Hx of seizure disorder and not on any medications  Has MS   .[name] Kitchen Active Ambulatory Problems    Diagnosis Date Noted   COMMON MIGRAINE 08/29/2007   Allergic rhinitis 01/30/2009   NUMBNESS, HAND 08/29/2007   ANEMIA, IRON DEFICIENCY, HX OF 08/29/2007   ANXIETY 07/02/2010   Generalized convulsive epilepsy ([facility]) 02/14/2011   Absence seizure ([facility]) 02/14/2011   Rectal bleeding 01/12/2014   Change in stool 01/12/2014   H. pylori infection 02/04/2014   Diverticulosis 02/04/2014   Peptic ulcer disease 07/01/2014   Dense breast tissue 08/26/2014   Breast cancer screening 08/26/2014   Allergic rhinitis due to pollen 03/06/2015   Thyromegaly 03/06/2015   Other fatigue 03/06/2015   Difficulty swallowing 03/06/2015   Seasonal allergies 03/06/2015   Menopausal symptoms 10/12/2015   Abnormal mammogram of left breast 10/23/2015   Hematuria 10/27/2015   Globus sensation 01/29/2016   Chronic cystitis 01/29/2016   Right arm weakness 04/04/2016   Right leg weakness 04/04/2016   Left eye pain 05/18/2016   Lower extremity weakness 06/20/2017   Seizures ([facility]) 06/20/2017   Crepitus of both knee joints 06/20/2017   Breast calcification, right 01/02/2018   CNS demyelination ([facility]) 12/02/2016   White matter abnormality on MRI of brain 07/27/2016   Atypical chest pain 07/13/2020   Pain of left thumb 07/13/2020   Palpitations 07/13/2020   Anxiety    Complication of anesthesia    GERD  (gastroesophageal reflux disease)    Chronic low back pain 11/14/2016   COVID 03/30/2020   Numbness and tingling of both legs 07/27/2016   Tingling 07/27/2016   Neck pain 07/27/2016   Angina pectoris (HCC) 07/27/2020   Dyspnea on exertion 07/27/2020   Abnormal stress test 07/27/2020   Frequent headaches 05/19/2021   Benign breast cyst in female, right 09/28/2021   Left bundle branch block (LBBB) 12/17/2021   Resolved Ambulatory Problems    Diagnosis Date Noted   UTI (lower urinary tract infection) 12/29/2010   Past Medical History:  Diagnosis Date   Chest pain 07/13/2020     ROS See HPI.    Objective:     BP (!) 151/73 (BP Location: Left Arm, Patient Position: Sitting, Cuff Size: Normal)   Pulse 77   Ht 5\' 2"  (1.575 m)   Wt 112 lb 8 oz (51 kg)   LMP 12/18/2010   SpO2 99%   BMI 20.58 kg/m  BP Readings from Last 3 Encounters:  12/27/22 (!) 144/71  05/23/22 122/74  02/22/22 (!) 140/68   Wt Readings from Last 3 Encounters:  12/27/22 112 lb 8 oz (51 kg)  05/23/22 111 lb (50.3 kg)  02/22/22 114 lb (51.7 kg)      Physical Exam    The 10-year ASCVD risk score (Arnett DK, et al., 2019) is: 2%    Assessment & Plan:     BP continues to be elevated on 2nd recheck, no  hx of HTN. Check at home and come in 2 weeks for recheck.   Elevated fasting glucose. Set up with CGM libre 3 in office.  Will review results in 2 weeks Keep food diary.   Use ativan as needed for tremor symptoms.   UA positive for blood which patient has hx since 2017 of but usually trace and is moderate today Will culture Will continue to monitor and discuss if we need to do any further work up or have urology review again   No follow-ups on file.    Tandy Gaw, PA-C

## 2022-12-28 ENCOUNTER — Encounter: Payer: Self-pay | Admitting: Physician Assistant

## 2022-12-29 ENCOUNTER — Encounter: Payer: Self-pay | Admitting: Physician Assistant

## 2022-12-29 LAB — URINE CULTURE

## 2022-12-29 NOTE — Progress Notes (Signed)
Records release for [facility] created and awaiting patient signature so I can fax the medical release form.

## 2022-12-30 NOTE — Progress Notes (Signed)
No significant bacteria accumulation.

## 2023-01-06 NOTE — Progress Notes (Signed)
Patient states Tandy Gaw had told her she could come to appt on 01/17/23 and sign the medical release form ( at front desk ) .

## 2023-01-17 ENCOUNTER — Encounter: Payer: Self-pay | Admitting: Physician Assistant

## 2023-01-17 ENCOUNTER — Ambulatory Visit: Payer: BC Managed Care – PPO | Admitting: Physician Assistant

## 2023-01-17 VITALS — BP 133/68 | HR 71 | Ht 62.0 in | Wt 113.0 lb

## 2023-01-17 DIAGNOSIS — R7303 Prediabetes: Secondary | ICD-10-CM | POA: Diagnosis not present

## 2023-01-17 DIAGNOSIS — Z87448 Personal history of other diseases of urinary system: Secondary | ICD-10-CM | POA: Diagnosis not present

## 2023-01-17 DIAGNOSIS — R030 Elevated blood-pressure reading, without diagnosis of hypertension: Secondary | ICD-10-CM

## 2023-01-17 DIAGNOSIS — E162 Hypoglycemia, unspecified: Secondary | ICD-10-CM

## 2023-01-17 NOTE — Patient Instructions (Signed)
Hypoglycemia Hypoglycemia is when the sugar (glucose) level in your blood is too low. Low blood sugar can happen to people who have diabetes and people who do not have diabetes. Low blood sugar can happen quickly, and it can be an emergency. What are the causes? This condition happens most often in people who have diabetes. It may be caused by: Diabetes medicine. Not eating enough, or not eating often enough. Doing more physical activity. Drinking alcohol on an empty stomach. If you do not have diabetes, this condition may be caused by: A tumor in the pancreas. Not eating enough, or not eating for long periods at a time (fasting). A very bad infection or illness. Problems after having weight loss (bariatric) surgery. Kidney failure or liver failure. Certain medicines. What increases the risk? This condition is more likely to develop in people who: Have diabetes and take medicines to lower their blood sugar. Abuse alcohol. Have a very bad illness. What are the signs or symptoms? Mild Hunger. Sweating and feeling clammy. Feeling dizzy or light-headed. Being sleepy or having trouble sleeping. Feeling like you may vomit (nauseous). A fast heartbeat. A headache. Blurry vision. Mood changes, such as: Being grouchy. Feeling worried or nervous (anxious). Tingling or loss of feeling (numbness) around your mouth, lips, or tongue. Moderate Confusion and poor judgment. Behavior changes. Weakness. Uneven heartbeat. Trouble with moving (coordination). Very low Very low blood sugar (severe hypoglycemia) is a medical emergency. It can cause: Fainting. Seizures. Loss of consciousness (coma). Death. How is this treated? Treating low blood sugar Low blood sugar is often treated by eating or drinking something that has sugar in it right away. The food or drink should contain 15 grams of a fast-acting carb (carbohydrate). Options include: 4 oz (120 mL) of fruit juice. 4 oz (120 mL) of  regular soda (not diet soda). A few pieces of hard candy. Check food labels to see how many pieces to eat for 15 grams. 1 Tbsp (15 mL) of sugar or honey. 4 glucose tablets. 1 tube of glucose gel. Treating low blood sugar if you have diabetes If you can think clearly and swallow safely, follow the 15:15 rule: Take 15 grams of a fast-acting carb. Talk with your doctor about how much you should take. Always keep a source of fast-acting carb with you, such as: Glucose tablets (take 4 tablets). A few pieces of hard candy. Check food labels to see how many pieces to eat for 15 grams. 4 oz (120 mL) of fruit juice. 4 oz (120 mL) of regular soda (not diet soda). 1 Tbsp (15 mL) of honey or sugar. 1 tube of glucose gel. Check your blood sugar 15 minutes after you take the carb. If your blood sugar is still at or below 70 mg/dL (3.9 mmol/L), take 15 grams of a carb again. If your blood sugar does not go above 70 mg/dL (3.9 mmol/L) after 3 tries, get help right away. After your blood sugar goes back to normal, eat a meal or a snack within 1 hour.  Treating very low blood sugar If your blood sugar is below 54 mg/dL (3 mmol/L), you have very low blood sugar, or severe hypoglycemia. This is an emergency. Get medical help right away. If you have very low blood sugar and you cannot eat or drink, you will need to be given a hormone called glucagon. A family member or friend should learn how to check your blood sugar and how to give you glucagon. Ask your doctor if   you need to have an emergency glucagon kit at home. Very low blood sugar may also need to be treated in a hospital. Follow these instructions at home: General instructions Take over-the-counter and prescription medicines only as told by your doctor. Stay aware of your blood sugar as told by your doctor. If you drink alcohol: Limit how much you have to: 0-1 drink a day for women who are not pregnant. 0-2 drinks a day for men. Know how much  alcohol is in your drink. In the [place] one drink equals one 12 oz bottle of beer (355 mL), one 5 oz glass of wine (148 mL), or one 1 oz glass of hard liquor (44 mL). Be sure to eat food when you drink alcohol. Know that your body absorbs alcohol quickly. This may lead to low blood sugar later. Be sure to keep checking your blood sugar. Keep all follow-up visits. If you have diabetes:  Always have a fast-acting carb (15 grams) with you to treat low blood sugar. Follow your diabetes care plan as told by your doctor. Make sure you: Know the symptoms of low blood sugar. Check your blood sugar as often as told. Always check it before and after exercise. Always check your blood sugar before you drive. Take your medicines as told. Follow your meal plan. Eat on time. Do not skip meals. Share your diabetes care plan with: Your work or school. People you live with. Carry a card or wear jewelry that says you have diabetes. Where to find more information American Diabetes Association: www.diabetes.org Contact a doctor if: You have trouble keeping your blood sugar in your target range. You have low blood sugar often. Get help right away if: You still have symptoms after you eat or drink something that contains 15 grams of fast-acting carb, and you cannot get your blood sugar above 70 mg/dL by following the 15:15 rule. Your blood sugar is below 54 mg/dL (3 mmol/L). You have a seizure. You faint. These symptoms may be an emergency. Get help right away. Call your local emergency services (911 in the [place] Do not wait to see if the symptoms will go away. Do not drive yourself to the hospital. Summary Hypoglycemia happens when the level of sugar (glucose) in your blood is too low. Low blood sugar can happen to people who have diabetes and people who do not have diabetes. Low blood sugar can happen quickly, and it can be an emergency. Make sure you know the symptoms of low blood sugar and know how  to treat it. Always keep a source of sugar (fast-acting carb) with you to treat low blood sugar. This information is not intended to replace advice given to you by your health care provider. Make sure you discuss any questions you have with your health care provider. Document Revised: 04/23/2020 Document Reviewed: 04/23/2020 Elsevier Patient Education  2024 [employer]  

## 2023-01-17 NOTE — Progress Notes (Signed)
Established Patient Office Visit  Subjective   Patient ID: Destiny King, female    DOB: July 26, 1965  Age: 57 y.o. MRN: [id number]  Chief Complaint  Patient presents with   Medical Management of Chronic Issues    B/p fup    HPI Pt had episode about 3 weeks ago where she was tremulous and had to go to ED twice and unclear dx. Her keppra was stopped because she read it could cause tremors. Her BP was elevated and her fasting glucose was elevated in hospital. She was placed with libre sensor and told to go over results. She has not had any more episodes. She has had some low BS around 60s.   .. Active Ambulatory Problems    Diagnosis Date Noted   COMMON MIGRAINE 08/29/2007   Allergic rhinitis 01/30/2009   NUMBNESS, HAND 08/29/2007   ANEMIA, IRON DEFICIENCY, HX OF 08/29/2007   ANXIETY 07/02/2010   Generalized convulsive epilepsy (HCC) 02/14/2011   Absence seizure (HCC) 02/14/2011   Rectal bleeding 01/12/2014   Change in stool 01/12/2014   H. pylori infection 02/04/2014   Diverticulosis 02/04/2014   Peptic ulcer disease 07/01/2014   Dense breast tissue 08/26/2014   Breast cancer screening 08/26/2014   Allergic rhinitis due to pollen 03/06/2015   Thyromegaly 03/06/2015   Other fatigue 03/06/2015   Difficulty swallowing 03/06/2015   Seasonal allergies 03/06/2015   Menopausal symptoms 10/12/2015   Abnormal mammogram of left breast 10/23/2015   Hematuria 10/27/2015   Globus sensation 01/29/2016   Chronic cystitis 01/29/2016   Right arm weakness 04/04/2016   Right leg weakness 04/04/2016   Left eye pain 05/18/2016   Lower extremity weakness 06/20/2017   Seizures (HCC) 06/20/2017   Crepitus of both knee joints 06/20/2017   Breast calcification, right 01/02/2018   CNS demyelination (HCC) 12/02/2016   White matter abnormality on MRI of brain 07/27/2016   Atypical chest pain 07/13/2020   Pain of left thumb 07/13/2020   Palpitations 07/13/2020   Anxiety    Complication of  anesthesia    GERD (gastroesophageal reflux disease)    Chronic low back pain 11/14/2016   COVID 03/30/2020   Numbness and tingling of both legs 07/27/2016   Tingling 07/27/2016   Neck pain 07/27/2016   Angina pectoris (HCC) 07/27/2020   Dyspnea on exertion 07/27/2020   Abnormal stress test 07/27/2020   Frequent headaches 05/19/2021   Benign breast cyst in female, right 09/28/2021   Left bundle branch block (LBBB) 12/17/2021   Elevated blood pressure reading 12/27/2022   Elevated glucose 12/27/2022   Tremor 12/27/2022   Leukocytes in urine 12/27/2022   Low blood sugar 01/17/2023   History of hematuria 01/17/2023   Prediabetes 01/17/2023   Resolved Ambulatory Problems    Diagnosis Date Noted   UTI (lower urinary tract infection) 12/29/2010   Past Medical History:  Diagnosis Date   Chest pain 07/13/2020    ROS See HPI.    Objective:     BP 133/68   Pulse 71   Ht 5\' 2"  (1.575 m)   Wt 113 lb (51.3 kg)   LMP 12/18/2010   SpO2 99%   BMI 20.67 kg/m  BP Readings from Last 3 Encounters:  01/17/23 133/68  12/27/22 (!) 144/71  05/23/22 122/74   Wt Readings from Last 3 Encounters:  01/17/23 113 lb (51.3 kg)  12/27/22 112 lb 8 oz (51 kg)  05/23/22 111 lb (50.3 kg)      Physical Exam Constitutional:  Appearance: Normal appearance.  HENT:     Head: Normocephalic.  Cardiovascular:     Rate and Rhythm: Normal rate.  Pulmonary:     Effort: Pulmonary effort is normal.  Neurological:     Mental Status: She is alert and oriented to person, place, and time.  Psychiatric:        Mood and Affect: Mood normal.      The 10-year ASCVD risk score (Arnett DK, et al., 2019) is: 1.5%    Assessment & Plan:  .Diyora was seen today for medical management of chronic issues.  Diagnoses and all orders for this visit:  Elevated blood pressure reading  Low blood sugar  History of hematuria  Prediabetes   Hematuria has been worked up in 2020 and patient declines any  further work up  BP looks good today  Reviewed libre results and mostly in normal range. Average is 5.7 but she does have some lows.  Declined any medication Discussed small frequent meals in response to symptoms  Get regular exercise and 60g of protein HO given Follow up in 6 months to recheck A1C   Tandy Gaw, PA-C

## 2023-04-04 DIAGNOSIS — G35 Multiple sclerosis: Secondary | ICD-10-CM | POA: Diagnosis not present

## 2023-04-04 DIAGNOSIS — H469 Unspecified optic neuritis: Secondary | ICD-10-CM | POA: Diagnosis not present

## 2023-04-04 DIAGNOSIS — G40109 Localization-related (focal) (partial) symptomatic epilepsy and epileptic syndromes with simple partial seizures, not intractable, without status epilepticus: Secondary | ICD-10-CM | POA: Diagnosis not present

## 2023-04-04 DIAGNOSIS — R5383 Other fatigue: Secondary | ICD-10-CM | POA: Diagnosis not present

## 2023-06-13 DIAGNOSIS — R519 Headache, unspecified: Secondary | ICD-10-CM | POA: Diagnosis not present

## 2023-06-13 DIAGNOSIS — H6692 Otitis media, unspecified, left ear: Secondary | ICD-10-CM | POA: Diagnosis not present

## 2023-06-13 DIAGNOSIS — R42 Dizziness and giddiness: Secondary | ICD-10-CM | POA: Diagnosis not present

## 2023-06-13 DIAGNOSIS — H8192 Unspecified disorder of vestibular function, left ear: Secondary | ICD-10-CM | POA: Diagnosis not present

## 2023-07-04 ENCOUNTER — Ambulatory Visit: Payer: BC Managed Care – PPO | Admitting: Physician Assistant

## 2023-07-04 VITALS — BP 121/80 | HR 73 | Temp 98.1°F | Ht 62.0 in | Wt 113.0 lb

## 2023-07-04 DIAGNOSIS — R55 Syncope and collapse: Secondary | ICD-10-CM | POA: Diagnosis not present

## 2023-07-04 DIAGNOSIS — R29898 Other symptoms and signs involving the musculoskeletal system: Secondary | ICD-10-CM | POA: Diagnosis not present

## 2023-07-04 DIAGNOSIS — R0981 Nasal congestion: Secondary | ICD-10-CM | POA: Diagnosis not present

## 2023-07-04 MED ORDER — FLUTICASONE PROPIONATE 50 MCG/ACT NA SUSP: 2.0000 | Freq: Every day | NASAL | 0 refills | Status: AC

## 2023-07-04 NOTE — Progress Notes (Unsigned)
   Acute Office Visit  Subjective:     Patient ID: Destiny King, female    DOB: 12-03-1965, 58 y.o.   MRN: [id number]  No chief complaint on file.   HPI Patient is in today for   ROS      Objective:    LMP 12/18/2010  BP Readings from Last 3 Encounters:  07/04/23 121/80  01/17/23 133/68  12/27/22 (!) 144/71   Wt Readings from Last 3 Encounters:  07/04/23 113 lb (51.3 kg)  01/17/23 113 lb (51.3 kg)  12/27/22 112 lb 8 oz (51 kg)      Physical Exam Constitutional:      Appearance: Normal appearance.  HENT:     Head: Normocephalic.     Right Ear: Tympanic membrane normal.     Left Ear: Tympanic membrane normal.  Cardiovascular:     Rate and Rhythm: Normal rate and regular rhythm.  Pulmonary:     Effort: Pulmonary effort is normal.     Breath sounds: Normal breath sounds.  Musculoskeletal:     Right lower leg: No edema.     Left lower leg: No edema.  Neurological:     General: No focal deficit present.     Mental Status: She is alert and oriented to person, place, and time.  Psychiatric:        Mood and Affect: Mood normal.           Assessment & Plan:   [name] KitchenMarland KitchenHawa was seen today for hospitalization follow-up.  Diagnoses and all orders for this visit:  Near syncope -     CMP14+EGFR -     TSH -     CBC w/Diff/Platelet -     Magnesium  Bilateral leg weakness -     CMP14+EGFR -     TSH -     CBC w/Diff/Platelet -     Magnesium  Chronic nasal congestion -     CMP14+EGFR -     TSH -     CBC w/Diff/Platelet -     Magnesium  Other orders -     fluticasone (FLONASE) 50 MCG/ACT nasal spray; Place 2 sprays into both nostrils daily.     No follow-ups on file.  Tandy Gaw, PA-C

## 2023-07-04 NOTE — Patient Instructions (Signed)
Get labs Make sure staying hydrated and 80g of protein.

## 2023-07-05 ENCOUNTER — Encounter: Payer: Self-pay | Admitting: Physician Assistant

## 2023-07-05 LAB — CBC WITH DIFFERENTIAL/PLATELET
Basophils Absolute: 0 10*3/uL (ref 0.0–0.2)
Basos: 1 %
EOS (ABSOLUTE): 0.1 10*3/uL (ref 0.0–0.4)
Eos: 2 %
Hematocrit: 36 % (ref 34.0–46.6)
Hemoglobin: 12.2 g/dL (ref 11.1–15.9)
Immature Grans (Abs): 0 10*3/uL (ref 0.0–0.1)
Immature Granulocytes: 0 %
Lymphocytes Absolute: 1.5 10*3/uL (ref 0.7–3.1)
Lymphs: 35 %
MCH: 32.3 pg (ref 26.6–33.0)
MCHC: 33.9 g/dL (ref 31.5–35.7)
MCV: 95 fL (ref 79–97)
Monocytes Absolute: 0.4 10*3/uL (ref 0.1–0.9)
Monocytes: 9 %
Neutrophils Absolute: 2.4 10*3/uL (ref 1.4–7.0)
Neutrophils: 53 %
Platelets: 197 10*3/uL (ref 150–450)
RBC: 3.78 x10E6/uL (ref 3.77–5.28)
RDW: 11.6 % — ABNORMAL LOW (ref 11.7–15.4)
WBC: 4.4 10*3/uL (ref 3.4–10.8)

## 2023-07-05 LAB — CMP14+EGFR
ALT: 24 [IU]/L (ref 0–32)
AST: 20 [IU]/L (ref 0–40)
Albumin: 4.6 g/dL (ref 3.8–4.9)
Alkaline Phosphatase: 80 [IU]/L (ref 44–121)
BUN/Creatinine Ratio: 17 (ref 9–23)
BUN: 16 mg/dL (ref 6–24)
Bilirubin Total: 0.5 mg/dL (ref 0.0–1.2)
CO2: 25 mmol/L (ref 20–29)
Calcium: 9.3 mg/dL (ref 8.7–10.2)
Chloride: 101 mmol/L (ref 96–106)
Creatinine, Ser: 0.93 mg/dL (ref 0.57–1.00)
Globulin, Total: 2.2 g/dL (ref 1.5–4.5)
Glucose: 90 mg/dL (ref 70–99)
Potassium: 4.1 mmol/L (ref 3.5–5.2)
Sodium: 141 mmol/L (ref 134–144)
Total Protein: 6.8 g/dL (ref 6.0–8.5)
eGFR: 72 mL/min/{1.73_m2} (ref >59)

## 2023-07-05 LAB — TSH: TSH: 1.94 u[IU]/mL (ref 0.450–4.500)

## 2023-07-05 LAB — MAGNESIUM: Magnesium: 2 mg/dL (ref 1.6–2.3)

## 2023-07-05 NOTE — Progress Notes (Signed)
Shellsea,   Thyroid, magnesium, potassium, sodium, glucose, liver, kidney all look great.  Normal WBC and hemoglobin.   Labs look great.

## 2023-07-10 ENCOUNTER — Encounter: Payer: Self-pay | Admitting: Physician Assistant

## 2023-09-12 DIAGNOSIS — H524 Presbyopia: Secondary | ICD-10-CM | POA: Diagnosis not present

## 2023-09-12 DIAGNOSIS — H52223 Regular astigmatism, bilateral: Secondary | ICD-10-CM | POA: Diagnosis not present

## 2023-09-12 DIAGNOSIS — H5213 Myopia, bilateral: Secondary | ICD-10-CM | POA: Diagnosis not present

## 2023-09-25 ENCOUNTER — Other Ambulatory Visit: Payer: Self-pay | Admitting: Physician Assistant

## 2023-09-25 DIAGNOSIS — Z1231 Encounter for screening mammogram for malignant neoplasm of breast: Secondary | ICD-10-CM

## 2024-02-06 ENCOUNTER — Encounter: Payer: Self-pay | Admitting: Sports Medicine

## 2024-03-19 DIAGNOSIS — H4311 Vitreous hemorrhage, right eye: Secondary | ICD-10-CM | POA: Diagnosis not present

## 2024-03-20 DIAGNOSIS — H43811 Vitreous degeneration, right eye: Secondary | ICD-10-CM | POA: Diagnosis not present

## 2024-03-26 ENCOUNTER — Other Ambulatory Visit: Payer: Self-pay | Admitting: Physician Assistant

## 2024-03-26 DIAGNOSIS — Z1231 Encounter for screening mammogram for malignant neoplasm of breast: Secondary | ICD-10-CM

## 2024-03-28 ENCOUNTER — Ambulatory Visit (INDEPENDENT_AMBULATORY_CARE_PROVIDER_SITE_OTHER)

## 2024-03-28 DIAGNOSIS — Z1231 Encounter for screening mammogram for malignant neoplasm of breast: Secondary | ICD-10-CM

## 2024-04-10 ENCOUNTER — Ambulatory Visit: Payer: Self-pay | Admitting: Physician Assistant

## 2024-04-10 NOTE — Progress Notes (Signed)
 Normal mammogram. Follow up in one year.

## 2024-04-22 DIAGNOSIS — H43811 Vitreous degeneration, right eye: Secondary | ICD-10-CM | POA: Diagnosis not present

## 2024-04-22 DIAGNOSIS — H43822 Vitreomacular adhesion, left eye: Secondary | ICD-10-CM | POA: Diagnosis not present
